# Patient Record
Sex: Male | Born: 1980 | Race: White | Hispanic: No | Marital: Married | Smoking: Former smoker
Health system: Southern US, Community
[De-identification: ages and names within clinical notes are randomized; demographics above are authoritative.]

## PROBLEM LIST (undated history)

## (undated) DIAGNOSIS — R569 Unspecified convulsions: Secondary | ICD-10-CM

## (undated) DIAGNOSIS — G809 Cerebral palsy, unspecified: Secondary | ICD-10-CM

## (undated) DIAGNOSIS — F419 Anxiety disorder, unspecified: Secondary | ICD-10-CM

## (undated) HISTORY — PX: OTHER SURGICAL HISTORY: SHX169

## (undated) HISTORY — PX: HAND SURGERY: SHX662

## (undated) HISTORY — DX: Unspecified convulsions: R56.9

---
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
## 2002-09-27 ENCOUNTER — Emergency Department (HOSPITAL_COMMUNITY): Admission: EM | Admit: 2002-09-27 | Discharge: 2002-09-28 | Payer: Self-pay | Admitting: Emergency Medicine

## 2007-06-14 ENCOUNTER — Ambulatory Visit: Payer: Self-pay | Admitting: Cardiology

## 2010-10-21 ENCOUNTER — Emergency Department (HOSPITAL_COMMUNITY)
Admission: EM | Admit: 2010-10-21 | Discharge: 2010-10-21 | Disposition: A | Discharge status: Home / Self Care | Payer: BC Managed Care – PPO | Attending: Emergency Medicine | Admitting: Emergency Medicine

## 2010-10-21 DIAGNOSIS — G43909 Migraine, unspecified, not intractable, without status migrainosus: Secondary | ICD-10-CM | POA: Insufficient documentation

## 2010-10-21 LAB — COMPREHENSIVE METABOLIC PANEL
ALT: 26 U/L (ref 0–53)
AST: 22 U/L (ref 0–37)
Albumin: 4.3 g/dL (ref 3.5–5.2)
Alkaline Phosphatase: 102 U/L (ref 39–117)
BUN: 8 mg/dL (ref 6–23)
CO2: 25 mEq/L (ref 19–32)
Calcium: 9.1 mg/dL (ref 8.4–10.5)
Chloride: 100 mEq/L (ref 96–112)
Creatinine, Ser: 0.84 mg/dL (ref 0.4–1.5)
GFR calc Af Amer: >60 mL/min (ref >60)
GFR calc non Af Amer: >60 mL/min (ref >60)
Glucose, Bld: 95 mg/dL (ref 70–99)
Potassium: 3.6 mEq/L (ref 3.5–5.1)
Sodium: 136 mEq/L (ref 135–145)
Total Bilirubin: 0.5 mg/dL (ref 0.3–1.2)
Total Protein: 7.1 g/dL (ref 6.0–8.3)

## 2010-10-21 LAB — DIFFERENTIAL
Basophils Absolute: 0 10*3/uL (ref 0.0–0.1)
Basophils Relative: 1 % (ref 0–1)
Eosinophils Absolute: 0.2 10*3/uL (ref 0.0–0.7)
Eosinophils Relative: 2 % (ref 0–5)
Lymphocytes Relative: 24 % (ref 12–46)
Lymphs Abs: 1.9 10*3/uL (ref 0.7–4.0)
Monocytes Absolute: 0.6 10*3/uL (ref 0.1–1.0)
Monocytes Relative: 7 % (ref 3–12)
Neutro Abs: 5.2 10*3/uL (ref 1.7–7.7)
Neutrophils Relative %: 66 % (ref 43–77)

## 2010-10-21 LAB — CBC
HCT: 43 % (ref 39.0–52.0)
Hemoglobin: 15.1 g/dL (ref 13.0–17.0)
MCH: 31.2 pg (ref 26.0–34.0)
MCHC: 35.1 g/dL (ref 30.0–36.0)
MCV: 88.8 fL (ref 78.0–100.0)
Platelets: 225 10*3/uL (ref 150–400)
RBC: 4.84 MIL/uL (ref 4.22–5.81)
RDW: 12 % (ref 11.5–15.5)
WBC: 7.9 10*3/uL (ref 4.0–10.5)

## 2010-10-21 LAB — CARBAMAZEPINE LEVEL, TOTAL: Carbamazepine Lvl: 8.4 ug/mL (ref 4.0–12.0)
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
## 2011-04-28 ENCOUNTER — Other Ambulatory Visit: Payer: Self-pay

## 2011-04-28 ENCOUNTER — Emergency Department (HOSPITAL_COMMUNITY)
Admission: EM | Admit: 2011-04-28 | Discharge: 2011-04-28 | Disposition: A | Discharge status: Home / Self Care | Payer: BC Managed Care – PPO | Attending: Emergency Medicine | Admitting: Emergency Medicine

## 2011-04-28 DIAGNOSIS — R002 Palpitations: Secondary | ICD-10-CM | POA: Insufficient documentation

## 2011-04-28 DIAGNOSIS — F410 Panic disorder [episodic paroxysmal anxiety] without agoraphobia: Secondary | ICD-10-CM | POA: Insufficient documentation

## 2011-04-28 DIAGNOSIS — F17200 Nicotine dependence, unspecified, uncomplicated: Secondary | ICD-10-CM | POA: Insufficient documentation

## 2011-04-28 HISTORY — DX: Cerebral palsy, unspecified: G80.9

## 2011-04-28 HISTORY — DX: Anxiety disorder, unspecified: F41.9

## 2011-04-28 HISTORY — DX: Unspecified convulsions: R56.9

## 2011-04-28 NOTE — ED Provider Notes (Signed)
History   Scribed for Dr. Bebe Shaggy, the patient was seen in room APA05/APA05. This chart was scribed by Clarita Crane. This patient's care was started at 11:23AM.   CSN: [id number] Arrival date & time: 04/28/2011 11:21 AM  Chief Complaint  Patient presents with  . Panic Attack    HPI Patrick Ray is a 30 y.o. male who presents to the Emergency Department complaining of episodic palpitations which occurred 20 minutes ago lasting 15-20 minutes but is currently resolved with no associated symptoms. States episode of palpitations is similar to that previously experienced with panic attacks. Denies LOC, syncope, seizure, HA, SOB, chest pain, vomiting, diaphoresis. Denies recent changes in medications, elicit drug abuse, recent extensive travel, family history of cardiac problems. Patient is a current smoker and has a h/o seizures. No drug use  HPI ELEMENTS: Onset: 20 minutes ago Duration: 15-20 minutes Quality: similar to that previously experienced with panic attacks Context:  as above  Associated symptoms: No associated symptoms.  Denies LOC, syncope, seizure, HA, SOB, chest pain, vomiting, diaphoresis.   PAST MEDICAL HISTORY:  Past Medical History  Diagnosis Date  . Seizure   . Anxiety   . Cerebral palsy     PAST SURGICAL HISTORY:  Past Surgical History  Procedure Date  . Hand surgery   . Arm surgery     FAMILY HISTORY:  No family history on file.   SOCIAL HISTORY: History   Social History  . Marital Status: Married    Spouse Name: N/A    Number of Children: N/A  . Years of Education: N/A   Social History Main Topics  . Smoking status: Current Everyday Smoker  . Smokeless tobacco: None  . Alcohol Use: No  . Drug Use:   . Sexually Active:    Other Topics Concern  . None   Social History Narrative  . None    Review of Systems 10 Systems reviewed and are negative for acute change except as noted in the HPI.  Allergies  Penicillins  Home Medications  No  current outpatient prescriptions on file.  BP 129/64  Pulse 86  Temp(Src) 98 F (36.7 C) (Oral)  Resp 18  Ht 5\' 10"  (1.778 m)  Wt 165 lb (74.844 kg)  BMI 23.68 kg/m2  SpO2 97%  Physical Exam CONSTITUTIONAL: Well developed/well nourished HEAD AND FACE: Normocephalic/atraumatic EYES: EOMI/PERRL ENMT: Mucous membranes moist NECK: supple no meningeal signs CV: S1/S2 noted, no murmurs/rubs/gallops noted LUNGS: Lungs are clear to auscultation bilaterally, no apparent distress NEURO: Pt is awake/alert, moves all extremitiesx4 EXTREMITIES: pulses normal, full ROM, radial pulses normal, PT pulses intact SKIN: warm, color normal PSYCH: no abnormalities of mood noted  ED Course  Procedures MDM   OTHER DATA REVIEWED: Nursing notes, vital signs, and past medical records reviewed.  DIAGNOSTIC STUDIES:   Date: 04/28/2011  Rate: 84 BPM  Rhythm: normal sinus rhythm  QRS Axis: normal  Intervals: normal  ST/T Wave abnormalities: normal  Conduction Disutrbances:none  Narrative Interpretation:   Old EKG Reviewed: none available   11:49AM- Previous records reviewed and considered. Patient's records reveal that patient with previous evaluation performed by Cardiologist for possible syncope though be due to seizures  MDM:   Pt well appearing, no syncope, no cp, no pleuritic pain Suspicion for serious cardiac dysrhythmia low   I personally performed the services described in this documentation, which was scribed in my presence. The recorded information has been reviewed and considered.         Dorinda Hill  Forestine Chute, MD 04/28/11 1236

## 2011-04-28 NOTE — ED Notes (Signed)
Pt reports pt was sitting in his office at work.  Reports felt very nervous and heart was racing.  Says was having to take deep breaths.  Reports EMS was called out for seizures but pt says did not have seizure.  Pt says thinks had anxiety attack.  Pt alert and oriented.

## 2012-05-14 ENCOUNTER — Ambulatory Visit: Payer: Managed Care, Other (non HMO) | Attending: Family Medicine | Admitting: Physical Therapy

## 2012-05-14 DIAGNOSIS — M25579 Pain in unspecified ankle and joints of unspecified foot: Secondary | ICD-10-CM | POA: Insufficient documentation

## 2012-05-14 DIAGNOSIS — M25669 Stiffness of unspecified knee, not elsewhere classified: Secondary | ICD-10-CM | POA: Insufficient documentation

## 2012-05-14 DIAGNOSIS — IMO0001 Reserved for inherently not codable concepts without codable children: Secondary | ICD-10-CM | POA: Insufficient documentation

## 2012-05-14 DIAGNOSIS — R262 Difficulty in walking, not elsewhere classified: Secondary | ICD-10-CM | POA: Insufficient documentation

## 2012-05-17 ENCOUNTER — Ambulatory Visit: Payer: Managed Care, Other (non HMO) | Admitting: Physical Therapy

## 2012-05-24 ENCOUNTER — Ambulatory Visit: Payer: Managed Care, Other (non HMO) | Admitting: Physical Therapy

## 2012-05-29 ENCOUNTER — Ambulatory Visit: Payer: Managed Care, Other (non HMO) | Attending: Family Medicine | Admitting: Physical Therapy

## 2012-05-29 DIAGNOSIS — R262 Difficulty in walking, not elsewhere classified: Secondary | ICD-10-CM | POA: Insufficient documentation

## 2012-05-29 DIAGNOSIS — IMO0001 Reserved for inherently not codable concepts without codable children: Secondary | ICD-10-CM | POA: Insufficient documentation

## 2012-05-29 DIAGNOSIS — M25579 Pain in unspecified ankle and joints of unspecified foot: Secondary | ICD-10-CM | POA: Insufficient documentation

## 2012-05-29 DIAGNOSIS — M25669 Stiffness of unspecified knee, not elsewhere classified: Secondary | ICD-10-CM | POA: Insufficient documentation

## 2013-01-03 ENCOUNTER — Other Ambulatory Visit (INDEPENDENT_AMBULATORY_CARE_PROVIDER_SITE_OTHER): Payer: PRIVATE HEALTH INSURANCE

## 2013-01-03 DIAGNOSIS — G40209 Localization-related (focal) (partial) symptomatic epilepsy and epileptic syndromes with complex partial seizures, not intractable, without status epilepticus: Secondary | ICD-10-CM

## 2013-01-03 NOTE — Progress Notes (Signed)
Patient here today for labs only for dr Jomarie Longs Chriss Czar.

## 2013-08-08 ENCOUNTER — Telehealth: Payer: Self-pay | Admitting: Family Medicine

## 2013-08-08 NOTE — Telephone Encounter (Signed)
PT HAVING CP WITH  MOVEMENT X 1 WEEK PER WIFE. PT AT [place] TO COME TO OFFICE TO BE SEEN BEFORE Monday. I ADVISED PT NEEDS TO GO TO ER TODAY TO BE EVALUATED. HIS WIFE SAID SHE WOULD DISCUSS WITH HIM.

## 2013-08-09 ENCOUNTER — Ambulatory Visit (INDEPENDENT_AMBULATORY_CARE_PROVIDER_SITE_OTHER): Payer: PRIVATE HEALTH INSURANCE | Admitting: Family Medicine

## 2013-08-09 ENCOUNTER — Ambulatory Visit (INDEPENDENT_AMBULATORY_CARE_PROVIDER_SITE_OTHER): Payer: PRIVATE HEALTH INSURANCE

## 2013-08-09 ENCOUNTER — Encounter: Payer: Self-pay | Admitting: Family Medicine

## 2013-08-09 VITALS — BP 115/67 | HR 78 | Temp 97.0°F | Wt 183.2 lb

## 2013-08-09 DIAGNOSIS — G808 Other cerebral palsy: Secondary | ICD-10-CM | POA: Insufficient documentation

## 2013-08-09 DIAGNOSIS — R079 Chest pain, unspecified: Secondary | ICD-10-CM

## 2013-08-09 DIAGNOSIS — R569 Unspecified convulsions: Secondary | ICD-10-CM | POA: Insufficient documentation

## 2013-08-09 DIAGNOSIS — G809 Cerebral palsy, unspecified: Secondary | ICD-10-CM | POA: Insufficient documentation

## 2013-08-09 DIAGNOSIS — M62838 Other muscle spasm: Secondary | ICD-10-CM

## 2013-08-09 NOTE — Patient Instructions (Addendum)
      Dr Paula Libra Recommendations  For nutrition information, I recommend books:  1).Eat to Live by Dr Excell Seltzer. 2).Prevent and Reverse Heart Disease by Dr Karl Luke. 3) Dr Janene Harvey Book:  Program to Reverse Diabetes  Exercise recommendations are:  If unable to walk, then the patient can exercise in a chair 3 times a day. By flapping arms like a bird gently and raising legs outwards to the front.  If ambulatory, the patient can go for walks for 30 minutes 3 times a week. Then increase the intensity and duration as tolerated.  Goal is to try to attain exercise frequency to 5 times a week.  If applicable: Best to perform resistance exercises (machines or weights) 2 days a week and cardio type exercises 3 days per week.   Aleve: 2 tablets every 12 hours for the muscle pain. Massage daily [facility].

## 2013-08-09 NOTE — Progress Notes (Signed)
Patient ID: Patrick Ray, male   DOB: 1981-05-07, 33 y.o.   MRN: [id number] SUBJECTIVE: CC: Chief Complaint  Patient presents with  . Acute Visit    pain comes and goes c/o pain left back rib area . states on thursday was putting on belt with his right hand stated on left side felt like "ice pick going left side . Denies SOB  no recent cough states when he takes tylenol or motrin the pain goes ilmmediately but pain returns in 6 hrs.     HPI: Start of this week. Woke up with a bad pain in the left side of the trunk. Hurt to breathe deep.sat around and dealt with the pain and took Xtra strength tylenol. After a couple of days everything was fine. Then he was getting dressed was putting on his belt with his pants on then he had a sharp pain of the left ribcage and left latissimus dorsi area. Was 2 minutes before could move.  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  Took some advil the pain went away but when he was going home the pain is returning. Past Medical History  Diagnosis Date  . Seizure   . Anxiety   . Cerebral palsy   . Seizures   . Cerebral palsy   . Forceps delivery    Past Surgical History  Procedure Laterality Date  . Hand surgery    . Arm surgery     History   Social History  . Marital Status: Married    Spouse Name: N/A    Number of Children: N/A  . Years of Education: N/A   Occupational History  . Not on file.   Social History Main Topics  . Smoking status: Current Every Day Smoker  . Smokeless tobacco: Not on file  . Alcohol Use: No  . Drug Use:   . Sexual Activity:    Other Topics Concern  . Not on file   Social History Narrative  . No narrative on file   No family history on file. Current Outpatient Prescriptions on File Prior to Visit  Medication Sig Dispense Refill  . calcium citrate-vitamin D 200-200 MG-UNIT TABS Take 1 tablet by mouth daily.        . carbamazepine (TEGRETOL XR) 400 MG 12 hr tablet Take 400 mg by mouth 3  (three) times daily after meals.        . cholecalciferol (VITAMIN D) 1000 UNITS tablet Take 1,000 Units by mouth daily.        Marland Kitchen FLUoxetine (PROZAC) 20 MG capsule Take 20 mg by mouth daily.        . multivitamin (THERAGRAN) per tablet Take 1 tablet by mouth daily.         No current facility-administered medications on file prior to visit.   Allergies  Allergen Reactions  . Penicillins    Immunization History  Administered Date(s) Administered  . Influenza Split 04/17/2013   Prior to Admission medications   Medication Sig Start Date End Date Taking? Authorizing Provider  calcium citrate-vitamin D 200-200 MG-UNIT TABS Take 1 tablet by mouth daily.      Historical Provider, MD  carbamazepine (TEGRETOL XR) 400 MG 12 hr tablet Take 400 mg by mouth 3 (three) times daily after meals.      Historical Provider, MD  cholecalciferol (VITAMIN D) 1000 UNITS tablet Take 1,000 Units by mouth daily.      Historical Provider, MD  FLUoxetine (PROZAC) 20 MG capsule Take 20 mg  by mouth daily.      Historical Provider, MD  multivitamin [facility]) per tablet Take 1 tablet by mouth daily.      Historical Provider, MD     ROS: As above in the HPI. All other systems are stable or negative.  OBJECTIVE: APPEARANCE:  Patient in no acute distress.The patient appeared well nourished and normally developed. Acyanotic. Waist: VITAL SIGNS:BP 115/67  Pulse 78  Temp(Src) 97 F (36.1 C) (Oral)  Wt 183 lb 3.2 oz (83.099 kg)   SKIN: warm and  Dry without overt rashes, tattoos and scars  HEAD and Neck: without JVD, Head and scalp: normal Eyes:No scleral icterus. Fundi normal, eye movements normal. Ears: Auricle normal, canal normal, Tympanic membranes normal, insufflation normal. Nose: normal Throat: normal Neck & thyroid: normal  CHEST & LUNGS: Chest wall: normal Lungs: Clear  CVS: Reveals the PMI to be normally located. Regular rhythm, First and Second Heart sounds are normal,  absence of  murmurs, rubs or gallops. Peripheral vasculature: Radial pulses: normal Dorsal pedis pulses: normal Posterior pulses: normal  ABDOMEN:  Appearance: normal Benign, no organomegaly, no masses, no Abdominal Aortic enlargement. No Guarding , no rebound. No Bruits. Bowel sounds: normal  RECTAL: N/A GU: N/A  EXTREMETIES: nonedematous.  MUSCULOSKELETAL:  Spine: slight thoracic scoliosis secondary to muscle assymetry. Left trunk smaller than the right Joints: intact Left Latissimus dorsi tight  NEUROLOGIC: oriented to time,place and person; left hemiplegic spasticity mild. Left hand in flexion. Less coordinated on the left side of the body. Strength is normal Sensory is normal Reflexes are normal Cranial Nerves are normal.  ASSESSMENT: Chest pain, unspecified - Plan: DG Chest 2 View  Muscle spasm  Cerebral palsy, hemiplegic  PLAN:  Orders Placed This Encounter  Procedures  . DG Chest 2 View    Standing Status: Future     Number of Occurrences: 1     Standing Expiration Date: 10/08/2014    Order Specific Question:  Reason for Exam (SYMPTOM  OR DIAGNOSIS REQUIRED)    Answer:  chest pain with movement    Order Specific Question:  Preferred imaging location?    Answer:  Internal  [facility] reading (PRIMARY) by  Dr. Jacelyn Grip: no acute disease seen.  Previous EKG is normal.                             Dr Paula Libra Recommendations  For nutrition information, I recommend books:  1).Eat to Live by Dr Excell Seltzer. 2).Prevent and Reverse Heart Disease by Dr Karl Luke. 3) Dr Janene Harvey Book:  Program to Reverse Diabetes  Exercise recommendations are:  If unable to walk, then the patient can exercise in a chair 3 times a day. By flapping arms like a bird gently and raising legs outwards to the front.  If ambulatory, the patient can go for walks for 30 minutes 3 times a week. Then increase the intensity and duration as tolerated.  Goal is to try to attain exercise  frequency to 5 times a week.  If applicable: Best to perform resistance exercises (machines or weights) 2 days a week and cardio type exercises 3 days per week.   Aleve: 2 tablets every 12 hours for the muscle pain. Massage daily [facility].     Meds ordered this encounter  Medications  . levETIRAcetam (KEPPRA) 500 MG tablet    Sig:   . Melatonin 10 MG CAPS    Sig: Take 1 capsule  by mouth at bedtime.   There are no discontinued medications. Return in about 2 months (around 10/07/2013) for CPE.  Addalynne Golding P. Jacelyn Grip, M.D.

## 2013-08-12 ENCOUNTER — Ambulatory Visit: Payer: PRIVATE HEALTH INSURANCE | Admitting: Family Medicine

## 2013-10-08 ENCOUNTER — Encounter: Payer: Self-pay | Admitting: Family Medicine

## 2013-10-08 ENCOUNTER — Ambulatory Visit (INDEPENDENT_AMBULATORY_CARE_PROVIDER_SITE_OTHER): Payer: PRIVATE HEALTH INSURANCE | Admitting: Family Medicine

## 2013-10-08 VITALS — BP 113/63 | HR 73 | Temp 98.0°F | Ht 70.0 in | Wt 183.6 lb

## 2013-10-08 DIAGNOSIS — Z13220 Encounter for screening for lipoid disorders: Secondary | ICD-10-CM

## 2013-10-08 DIAGNOSIS — Z0000 Encounter for general adult medical examination without abnormal findings: Secondary | ICD-10-CM | POA: Insufficient documentation

## 2013-10-08 DIAGNOSIS — G809 Cerebral palsy, unspecified: Secondary | ICD-10-CM

## 2013-10-08 DIAGNOSIS — R569 Unspecified convulsions: Secondary | ICD-10-CM

## 2013-10-08 DIAGNOSIS — G808 Other cerebral palsy: Secondary | ICD-10-CM

## 2013-10-08 DIAGNOSIS — M62838 Other muscle spasm: Secondary | ICD-10-CM

## 2013-10-08 NOTE — Progress Notes (Signed)
Patient ID: Patrick Ray, male   DOB: 11-22-1980, 33 y.o.   MRN: [id number] SUBJECTIVE: CC: Chief Complaint  Patient presents with  . Annual Exam    cpx no complaints     HPI:  See Neurologist Dr Sabra Heck in [place] . Checked approximately 6 months ago. Blood levels were fine. [facility]. Has been quite a long time since he has had a seizure. Last one was last year  Annual physical.   Past Medical History  Diagnosis Date  . Seizure   . Anxiety   . Cerebral palsy   . Seizures   . Cerebral palsy   . Forceps delivery    Past Surgical History  Procedure Laterality Date  . Hand surgery    . Arm surgery     History   Social History  . Marital Status: Married    Spouse Name: N/A    Number of Children: N/A  . Years of Education: N/A   Occupational History  . Not on file.   Social History Main Topics  . Smoking status: Current Every Day Smoker  . Smokeless tobacco: Not on file  . Alcohol Use: No  . Drug Use:   . Sexual Activity:    Other Topics Concern  . Not on file   Social History Narrative  . No narrative on file   No family history on file. Current Outpatient Prescriptions on File Prior to Visit  Medication Sig Dispense Refill  . calcium citrate-vitamin D 200-200 MG-UNIT TABS Take 1 tablet by mouth daily.        . carbamazepine (TEGRETOL XR) 400 MG 12 hr tablet Take 400 mg by mouth 3 (three) times daily after meals.        . cholecalciferol (VITAMIN D) 1000 UNITS tablet Take 1,000 Units by mouth daily.        Marland Kitchen FLUoxetine (PROZAC) 20 MG capsule Take 20 mg by mouth daily.        Marland Kitchen levETIRAcetam (KEPPRA) 500 MG tablet       . Melatonin 10 MG CAPS Take 1 capsule by mouth at bedtime.      . multivitamin (THERAGRAN) per tablet Take 1 tablet by mouth daily.         No current facility-administered medications on file prior to visit.   Allergies  Allergen Reactions  . Penicillins    Immunization History  Administered Date(s) Administered  .  Influenza Split 04/17/2013  . Tetanus 12/14/2006   Prior to Admission medications   Medication Sig Start Date End Date Taking? Authorizing Provider  calcium citrate-vitamin D 200-200 MG-UNIT TABS Take 1 tablet by mouth daily.      Historical Provider, MD  carbamazepine (TEGRETOL XR) 400 MG 12 hr tablet Take 400 mg by mouth 3 (three) times daily after meals.      Historical Provider, MD  cholecalciferol (VITAMIN D) 1000 UNITS tablet Take 1,000 Units by mouth daily.      Historical Provider, MD  FLUoxetine (PROZAC) 20 MG capsule Take 20 mg by mouth daily.      Historical Provider, MD  levETIRAcetam (KEPPRA) 500 MG tablet  07/31/13   Historical Provider, MD  Melatonin 10 MG CAPS Take 1 capsule by mouth at bedtime.    Historical Provider, MD  multivitamin [facility]) per tablet Take 1 tablet by mouth daily.      Historical Provider, MD     ROS: As above in the HPI. All other systems are stable or negative.  OBJECTIVE:  APPEARANCE:  Patient in no acute distress.The patient appeared well nourished and normally developed. Acyanotic. Waist: VITAL SIGNS:BP 113/63  Pulse 73  Temp(Src) 98 F (36.7 C) (Oral)  Ht 5' 10"  (1.778 m)  Wt 183 lb 9.6 oz (83.28 kg)  BMI 26.34 kg/m2 WM  SKIN: warm and  Dry without overt rashes, tattoos and scars  HEAD and Neck: without JVD, Head and scalp: normal Eyes:No scleral icterus. Fundi normal, eye movements normal. Ears: Auricle normal, canal normal, Tympanic membranes normal, insufflation normal. Nose: normal Throat: normal Neck & thyroid: normal  CHEST & LUNGS: Chest wall: normal Lungs: Clear  CVS: Reveals the PMI to be normally located. Regular rhythm, First and Second Heart sounds are normal,  absence of murmurs, rubs or gallops. Peripheral vasculature: Radial pulses: normal Dorsal pedis pulses: normal Posterior pulses: normal  ABDOMEN:  Appearance: normal Benign, no organomegaly, no masses, no Abdominal Aortic enlargement. No Guarding ,  no rebound. No Bruits. Bowel sounds: normal  RECTAL: N/A GU: Normal  EXTREMETIES: nonedematous.  MUSCULOSKELETAL:  Spine: normal Joints:left upper extremity spasticity  NEUROLOGIC: oriented to time,place and person; mild left hemiplegia  ASSESSMENT:  Annual physical exam - Plan: CMP14+EGFR  Cerebral palsy, hemiplegic  Cerebral palsy  Seizures  Muscle spasm  Forceps delivery  Screening cholesterol level - Plan: Lipid panel  PLAN:      HEALTH MAINTENANCE Immunizations: Tetanus-Diphtheria Booster due:2018 Pertusis Booster due:2018 Flu Shot Due: every Fall Pneumonia Vaccine: usually at 32 years of age unless there are certain risk situations. Herpes Zoster/Shingles Vaccine due: usually at 32 years of age HPV FGB:MSXJ age 88 to 54 years in males and females.  Healthy Life Habits: Exercise Goal: 5-6 days/week; start gradually(ie 30 minutes/3days per week) Nutrition: Balanced healthy meals including Vegetables and Fruits. Consider  Reading the following books: 1) Eat to Live by Dr Diona Browner; 2) Prevent and Reverse Heart Disease by Dr Karl Luke.  Vitamins:multivitamin okay Aspirin:n/a Stop Tobacco Use:++++ Seat Belt Use:+++ recommended Sunscreen Use:+++ recommended Recommended Screening Tests: Colon Cancer Screening: at 33 years old Blood work: today Cholesterol Screening:     today       Eye Exam: every 1 to 2 years recommended Dental Health: at least every 6 months  Others:    Living Will/Healthcare Power of Attorney: should have this in order with your personal estate planning   Smoking cessation discussed counselled and handout in the AVS. His cigarettes are free.   Orders Placed This Encounter  Procedures  . CMP14+EGFR    Standing Status: Future     Number of Occurrences:      Standing Expiration Date: 10/09/2014    Order Specific Question:  Has the patient fasted?    Answer:  Yes  . Lipid panel    Standing Status: Future     Number  of Occurrences:      Standing Expiration Date: 10/09/2014    Order Specific Question:  Has the patient fasted?    Answer:  Yes   Meds ordered this encounter  Medications  . Omega-3 Fatty Acids (FISH OIL) 300 MG CAPS    Sig: Take 300 mg by mouth 2 (two) times daily.   There are no discontinued medications. Return if symptoms worsen or fail to improve.  Keelon Zurn P. Jacelyn Grip, M.D.

## 2013-10-08 NOTE — Patient Instructions (Addendum)
HEALTH MAINTENANCE Immunizations: Tetanus-Diphtheria Booster due:2018 Pertusis Booster due:2018 Flu Shot Due: every Fall Pneumonia Vaccine: usually at 32 years of age unless there are certain risk situations. Herpes Zoster/Shingles Vaccine due: usually at 32 years of age HPV RXV:[id number] age 59 to 31 years in males and females.  Healthy Life Habits: Exercise Goal: 5-6 days/week; start gradually(ie 30 minutes/3days per week) Nutrition: Balanced healthy meals including Vegetables and Fruits. Consider  Reading the following books: 1) Eat to Live by Dr Diona Browner; 2) Prevent and Reverse Heart Disease by Dr Karl Luke.  Vitamins:multivitamin okay Aspirin:n/a Stop Tobacco Use:++++ Seat Belt Use:+++ recommended Sunscreen Use:+++ recommended Recommended Screening Tests: Colon Cancer Screening: at 33 years old Blood work: today Cholesterol Screening:     today       Eye Exam: every 1 to 2 years recommended Dental Health: at least every 6 months  Others:    Living Will/Healthcare Power of Attorney: should have this in order with your personal estate planning   Smoking Cessation Quitting smoking is important to your health and has many advantages. However, it is not always easy to quit since nicotine is a very addictive drug. Often times, people try 3 times or more before being able to quit. This document explains the best ways for you to prepare to quit smoking. Quitting takes hard work and a lot of effort, but you can do it. ADVANTAGES OF QUITTING SMOKING  You will live longer, feel better, and live better.  Your body will feel the impact of quitting smoking almost immediately.  Within 20 minutes, blood pressure decreases. Your pulse returns to its normal level.  After 8 hours, carbon monoxide levels in the blood return to normal. Your oxygen level increases.  After 24 hours, the chance of having a heart attack starts to decrease. Your breath, hair, and body stop smelling  like smoke.  After 48 hours, damaged nerve endings begin to recover. Your sense of taste and smell improve.  After 72 hours, the body is virtually free of nicotine. Your bronchial tubes relax and breathing becomes easier.  After 2 to 12 weeks, lungs can hold more air. Exercise becomes easier and circulation improves.  The risk of having a heart attack, stroke, cancer, or lung disease is greatly reduced.  After 1 year, the risk of coronary heart disease is cut in half.  After 5 years, the risk of stroke falls to the same as a nonsmoker.  After 10 years, the risk of lung cancer is cut in half and the risk of other cancers decreases significantly.  After 15 years, the risk of coronary heart disease drops, usually to the level of a nonsmoker.  If you are pregnant, quitting smoking will improve your chances of having a healthy baby.  The people you live with, especially any children, will be healthier.  You will have extra money to spend on things other than cigarettes. QUESTIONS TO THINK ABOUT BEFORE ATTEMPTING TO QUIT You may want to talk about your answers with your caregiver.  Why do you want to quit?  If you tried to quit in the past, what helped and what did not?  What will be the most difficult situations for you after you quit? How will you plan to handle them?  Who can help you through the tough times? Your family? Friends? A caregiver?  What pleasures do you get from smoking? What ways can you still get pleasure if you quit? Here are some questions to ask your caregiver:  How can you help me to be successful at quitting?  What medicine do you think would be best for me and how should I take it?  What should I do if I need more help?  What is smoking withdrawal like? How can I get information on withdrawal? GET READY  Set a quit date.  Change your environment by getting rid of all cigarettes, ashtrays, matches, and lighters in your home, car, or work. Do not let  people smoke in your home.  Review your past attempts to quit. Think about what worked and what did not. GET SUPPORT AND ENCOURAGEMENT You have a better chance of being successful if you have help. You can get support in many ways.  Tell your family, friends, and co-workers that you are going to quit and need their support. Ask them not to smoke around you.  Get individual, group, or telephone counseling and support. Programs are available at [employer] and health centers. Call your local health department for information about programs in your area.  Spiritual beliefs and practices may help some smokers quit.  Download a "quit meter" on your computer to keep track of quit statistics, such as how long you have gone without smoking, cigarettes not smoked, and money saved.  Get a self-help book about quitting smoking and staying off of tobacco. [place] yourself from urges to smoke. Talk to someone, go for a walk, or occupy your time with a task.  Change your normal routine. Take a different route to work. Drink tea instead of coffee. Eat breakfast in a different place.  Reduce your stress. Take a hot bath, exercise, or read a book.  Plan something enjoyable to do every day. Reward yourself for not smoking.  Explore interactive web-based programs that specialize in helping you quit. GET MEDICINE AND USE IT CORRECTLY Medicines can help you stop smoking and decrease the urge to smoke. Combining medicine with the above behavioral methods and support can greatly increase your chances of successfully quitting smoking.  Nicotine replacement therapy helps deliver nicotine to your body without the negative effects and risks of smoking. Nicotine replacement therapy includes nicotine gum, lozenges, inhalers, nasal sprays, and skin patches. Some may be available over-the-counter and others require a prescription.  Antidepressant medicine helps people abstain  from smoking, but how this works is unknown. This medicine is available by prescription.  Nicotinic receptor partial agonist medicine simulates the effect of nicotine in your brain. This medicine is available by prescription. Ask your caregiver for advice about which medicines to use and how to use them based on your health history. Your caregiver will tell you what side effects to look out for if you choose to be on a medicine or therapy. Carefully read the information on the package. Do not use any other product containing nicotine while using a nicotine replacement product.  RELAPSE OR DIFFICULT SITUATIONS Most relapses occur within the first 3 months after quitting. Do not be discouraged if you start smoking again. Remember, most people try several times before finally quitting. You may have symptoms of withdrawal because your body is used to nicotine. You may crave cigarettes, be irritable, feel very hungry, cough often, get headaches, or have difficulty concentrating. The withdrawal symptoms are only temporary. They are strongest when you first quit, but they will go away within 10 14 days. To reduce the chances of relapse, try to:  Avoid drinking alcohol. Drinking lowers your chances of successfully quitting.  Reduce the amount of caffeine you consume. Once you quit smoking, the amount of caffeine in your body increases and can give you symptoms, such as a rapid heartbeat, sweating, and anxiety.  Avoid smokers because they can make you want to smoke.  Do not let weight gain distract you. Many smokers will gain weight when they quit, usually less than 10 pounds. Eat a healthy diet and stay active. You can always lose the weight gained after you quit.  Find ways to improve your mood other than smoking. FOR MORE INFORMATION  www.smokefree.gov  Document Released: 07/05/2001 Document Revised: 01/10/2012 Document Reviewed: 10/20/2011 [facility] Patient Information 2014 [place], [place].

## 2013-10-10 ENCOUNTER — Other Ambulatory Visit: Payer: PRIVATE HEALTH INSURANCE

## 2013-10-10 DIAGNOSIS — Z13220 Encounter for screening for lipoid disorders: Secondary | ICD-10-CM

## 2013-10-10 DIAGNOSIS — Z0000 Encounter for general adult medical examination without abnormal findings: Secondary | ICD-10-CM

## 2013-10-11 LAB — CMP14+EGFR
ALT: 28 IU/L (ref 0–44)
AST: 19 IU/L (ref 0–40)
Albumin/Globulin Ratio: 1.7 (ref 1.1–2.5)
Albumin: 4.5 g/dL (ref 3.5–5.5)
Alkaline Phosphatase: 120 IU/L — ABNORMAL HIGH (ref 39–117)
BUN/Creatinine Ratio: 11 (ref 8–19)
BUN: 12 mg/dL (ref 6–20)
CO2: 24 mmol/L (ref 18–29)
Calcium: 8.6 mg/dL — ABNORMAL LOW (ref 8.7–10.2)
Chloride: 103 mmol/L (ref 97–108)
Creatinine, Ser: 1.07 mg/dL (ref 0.76–1.27)
GFR calc Af Amer: 106 mL/min/{1.73_m2} (ref >59)
GFR calc non Af Amer: 92 mL/min/{1.73_m2} (ref >59)
Globulin, Total: 2.6 g/dL (ref 1.5–4.5)
Glucose: 89 mg/dL (ref 65–99)
Potassium: 4.2 mmol/L (ref 3.5–5.2)
Sodium: 143 mmol/L (ref 134–144)
Total Bilirubin: 0.3 mg/dL (ref 0.0–1.2)
Total Protein: 7.1 g/dL (ref 6.0–8.5)

## 2013-10-11 LAB — LIPID PANEL
Chol/HDL Ratio: 6.6 ratio units — ABNORMAL HIGH (ref 0.0–5.0)
Cholesterol, Total: 249 mg/dL — ABNORMAL HIGH (ref 100–199)
HDL: 38 mg/dL — ABNORMAL LOW (ref >39)
LDL Calculated: 176 mg/dL — ABNORMAL HIGH (ref 0–99)
Triglycerides: 173 mg/dL — ABNORMAL HIGH (ref 0–149)
VLDL Cholesterol Cal: 35 mg/dL (ref 5–40)

## 2013-10-12 ENCOUNTER — Other Ambulatory Visit: Payer: Self-pay | Admitting: Family Medicine

## 2013-10-12 DIAGNOSIS — E785 Hyperlipidemia, unspecified: Secondary | ICD-10-CM

## 2013-10-12 NOTE — Progress Notes (Signed)
Quick Note:  Call Patient Labs that are abnormal: Lipids are high  The rest are at goal  Recommendations: Needs to stop all smoking. Needs to read the book " EAT TO LIVE" Plant based Diet and exercise. Needs a lab appointment to recheck the lipids in 3 months. If high he wil need to be on a statin. Lab ordered in [facility].   ______

## 2013-10-15 ENCOUNTER — Telehealth: Payer: Self-pay | Admitting: Family Medicine

## 2013-10-15 NOTE — Telephone Encounter (Signed)
Notified pt of lab results 

## 2014-03-25 ENCOUNTER — Telehealth: Payer: Self-pay | Admitting: Family Medicine

## 2014-03-25 NOTE — Telephone Encounter (Signed)
appt given for tomorrow with Bill.

## 2014-03-26 ENCOUNTER — Ambulatory Visit (INDEPENDENT_AMBULATORY_CARE_PROVIDER_SITE_OTHER): Payer: PRIVATE HEALTH INSURANCE | Admitting: Family Medicine

## 2014-03-26 VITALS — BP 123/72 | HR 75 | Temp 97.4°F | Ht 70.0 in | Wt 183.4 lb

## 2014-03-26 DIAGNOSIS — M25519 Pain in unspecified shoulder: Secondary | ICD-10-CM

## 2014-03-26 DIAGNOSIS — M25512 Pain in left shoulder: Secondary | ICD-10-CM

## 2014-03-26 MED ORDER — METAXALONE 800 MG PO TABS: 800.0000 mg | ORAL_TABLET | Freq: Three times a day (TID) | ORAL | Status: DC

## 2014-03-26 MED ORDER — NAPROXEN 500 MG PO TABS: 500.0000 mg | ORAL_TABLET | Freq: Two times a day (BID) | ORAL | Status: DC

## 2014-03-26 NOTE — Progress Notes (Signed)
   Subjective:    Patient ID: Patrick Ray, male    DOB: 1980-11-18, 33 y.o.   MRN: [id number]  HPI  C/o left shoulder pain and discomfort for several days.  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  Review of Systems No chest pain, SOB, HA, dizziness, vision change, N/V, diarrhea, constipation, dysuria, urinary urgency or frequency, myalgias, arthralgias or rash.     Objective:   Physical Exam Vital signs noted  Well developed well nourished male.  HEENT - Head atraumatic Normocephalic                Eyes - PERRLA, Conjuctiva - clear Sclera- Clear EOMI                Ears - EAC's Wnl TM's Wnl Gross Hearing WNL                Throat - oropharanx wnl Respiratory - Lungs CTA bilateral Cardiac - RRR S1 and S2 without murmur GI - Abdomen soft Nontender and bowel sounds active x 4      Assessment & Plan:  Left shoulder pain Naprosyn 500mg  one po bid x 10 days #20 Zanaflex 800mg  one po tid #30w/5rf Follow up prn if not better.  Lysbeth Penner FNP

## 2015-03-12 ENCOUNTER — Ambulatory Visit (INDEPENDENT_AMBULATORY_CARE_PROVIDER_SITE_OTHER): Payer: PRIVATE HEALTH INSURANCE | Admitting: Family Medicine

## 2015-03-12 ENCOUNTER — Encounter: Payer: Self-pay | Admitting: Family Medicine

## 2015-03-12 VITALS — BP 124/75 | HR 84 | Temp 97.3°F | Ht 70.0 in | Wt 185.6 lb

## 2015-03-12 DIAGNOSIS — J0111 Acute recurrent frontal sinusitis: Secondary | ICD-10-CM

## 2015-03-12 DIAGNOSIS — J0110 Acute frontal sinusitis, unspecified: Secondary | ICD-10-CM | POA: Insufficient documentation

## 2015-03-12 MED ORDER — AZITHROMYCIN 250 MG PO TABS: ORAL_TABLET | ORAL | Status: DC

## 2015-03-12 NOTE — Assessment & Plan Note (Signed)
Frontal sinusitis with history of recurrent sinusitis Has only been ill for 4 days, ask him to wait 4 more days and try aggressive nasal saline in the meantime. If not better in 4 days start azithromycin Plenty of fluids Return precautions provided

## 2015-03-12 NOTE — Patient Instructions (Signed)
For the next four days get plenty of fluids and try nasal saline. You may use her try nasal saline spray 3-4 times a day with good nose blowing or the neti pot (watch teh video on youtube).   If you're not better by Monday go ahead and try the anti-biotics.    Sinusitis Sinusitis is redness, soreness, and inflammation of the paranasal sinuses. Paranasal sinuses are air pockets within the bones of your face (beneath the eyes, the middle of the forehead, or above the eyes). In healthy paranasal sinuses, mucus is able to drain out, and air is able to circulate through them by way of your nose. However, when your paranasal sinuses are inflamed, mucus and air can become trapped. This can allow bacteria and other germs to grow and cause infection. Sinusitis can develop quickly and last only a short time (acute) or continue over a long period (chronic). Sinusitis that lasts for more than 12 weeks is considered chronic.  CAUSES  Causes of sinusitis include:  Allergies.  Structural abnormalities, such as displacement of the cartilage that separates your nostrils (deviated septum), which can decrease the air flow through your nose and sinuses and affect sinus drainage.  Functional abnormalities, such as when the small hairs (cilia) that line your sinuses and help remove mucus do not work properly or are not present. SIGNS AND SYMPTOMS  Symptoms of acute and chronic sinusitis are the same. The primary symptoms are pain and pressure around the affected sinuses. Other symptoms include:  Upper toothache.  Earache.  Headache.  Bad breath.  Decreased sense of smell and taste.  A cough, which worsens when you are lying flat.  Fatigue.  Fever.  Thick drainage from your nose, which often is green and may contain pus (purulent).  Swelling and warmth over the affected sinuses. DIAGNOSIS  Your health care provider will perform a physical exam. During the exam, your health care provider may:  Look  in your nose for signs of abnormal growths in your nostrils (nasal polyps).  Tap over the affected sinus to check for signs of infection.  View the inside of your sinuses (endoscopy) using an imaging device that has a light attached (endoscope). If your health care provider suspects that you have chronic sinusitis, one or more of the following tests may be recommended:  Allergy tests.  Nasal culture. A sample of mucus is taken from your nose, sent to a lab, and screened for bacteria.  Nasal cytology. A sample of mucus is taken from your nose and examined by your health care provider to determine if your sinusitis is related to an allergy. TREATMENT  Most cases of acute sinusitis are related to a viral infection and will resolve on their own within 10 days. Sometimes medicines are prescribed to help relieve symptoms (pain medicine, decongestants, nasal steroid sprays, or saline sprays).  However, for sinusitis related to a bacterial infection, your health care provider will prescribe antibiotic medicines. These are medicines that will help kill the bacteria causing the infection.  Rarely, sinusitis is caused by a fungal infection. In theses cases, your health care provider will prescribe antifungal medicine. For some cases of chronic sinusitis, surgery is needed. Generally, these are cases in which sinusitis recurs more than 3 times per year, despite other treatments. HOME CARE INSTRUCTIONS   Drink plenty of water. Water helps thin the mucus so your sinuses can drain more easily.  Use a humidifier.  Inhale steam 3 to 4 times a day (for example,  sit in the bathroom with the shower running).  Apply a warm, moist washcloth to your face 3 to 4 times a day, or as directed by your health care provider.  Use saline nasal sprays to help moisten and clean your sinuses.  Take medicines only as directed by your health care provider.  If you were prescribed either an antibiotic or antifungal  medicine, finish it all even if you start to feel better. SEEK IMMEDIATE MEDICAL CARE IF:  You have increasing pain or severe headaches.  You have nausea, vomiting, or drowsiness.  You have swelling around your face.  You have vision problems.  You have a stiff neck.  You have difficulty breathing. MAKE SURE YOU:   Understand these instructions.  Will watch your condition.  Will get help right away if you are not doing well or get worse. Document Released: 07/11/2005 Document Revised: 11/25/2013 Document Reviewed: 07/26/2011 [facility] Patient Information 2015 [place], [place]. This information is not intended to replace advice given to you by your health care provider. Make sure you discuss any questions you have with your health care provider.

## 2015-03-12 NOTE — Progress Notes (Signed)
Patient ID: Patrick Ray, male   DOB: 12-23-1980, 34 y.o.   MRN: [id number]   HPI  Patient presents today for acute visit tonight he went for sinus infection  Patient explains that he gets 2-3 sinus infections per year, usually only when the weather changes. For the last 4 days he's had frontal sinus pressure, postnasal drip, productive cough, and generalized 10 tightness. He denies fever, chills, malaise, change in appetite, dyspnea, and chest pain  He is allergic to penicillin seen usually uses azithromycin with good results.  PMH: Smoking status noted ROS: Per HPI  Objective: BP 124/75 mmHg  Pulse 84  Temp(Src) 97.3 F (36.3 C) (Oral)  Ht 5\' 10"  (1.778 m)  Wt 185 lb 9.6 oz (84.188 kg)  BMI 26.63 kg/m2 Gen: NAD, alert, cooperative with exam HEENT: NCAT, TMs WNL BL, nares with swollen turbinates especially on the right, oropharynx clear, no tenderness to palpation of the frontal or maxillary sinuses Neck: No tender lymphadenopathy CV: RRR, good S1/S2, no murmur Resp: CTABL, no wheezes, non-labored Ext: No edema, warm  Assessment and plan:  Sinusitis, acute frontal Frontal sinusitis with history of recurrent sinusitis Has only been ill for 4 days, ask him to wait 4 more days and try aggressive nasal saline in the meantime. If not better in 4 days start azithromycin Plenty of fluids Return precautions provided    Meds ordered this encounter  Medications  . azithromycin (ZITHROMAX Z-PAK) 250 MG tablet    Sig: As directed    Dispense:  1 each    Refill:  [place], MD [facility] 03/12/2015, 3:52 PM

## 2016-02-17 ENCOUNTER — Encounter: Payer: Self-pay | Admitting: Family Medicine

## 2016-02-17 ENCOUNTER — Encounter: Payer: Self-pay | Admitting: *Deleted

## 2016-02-17 ENCOUNTER — Ambulatory Visit (INDEPENDENT_AMBULATORY_CARE_PROVIDER_SITE_OTHER): Payer: BLUE CROSS/BLUE SHIELD | Admitting: Family Medicine

## 2016-02-17 VITALS — BP 120/73 | HR 85 | Temp 97.7°F | Ht 70.0 in | Wt 194.0 lb

## 2016-02-17 DIAGNOSIS — R112 Nausea with vomiting, unspecified: Secondary | ICD-10-CM | POA: Diagnosis not present

## 2016-02-17 DIAGNOSIS — R6889 Other general symptoms and signs: Secondary | ICD-10-CM

## 2016-02-17 DIAGNOSIS — B349 Viral infection, unspecified: Secondary | ICD-10-CM

## 2016-02-17 DIAGNOSIS — R52 Pain, unspecified: Secondary | ICD-10-CM | POA: Diagnosis not present

## 2016-02-17 DIAGNOSIS — M25512 Pain in left shoulder: Secondary | ICD-10-CM

## 2016-02-17 LAB — CULTURE, GROUP A STREP

## 2016-02-17 LAB — VERITOR FLU A/B WAIVED
INFLUENZA A: NEGATIVE
Influenza B: NEGATIVE

## 2016-02-17 LAB — RAPID STREP SCREEN (MED CTR MEBANE ONLY): STREP GP A AG, IA W/REFLEX: NEGATIVE

## 2016-02-17 NOTE — Patient Instructions (Signed)
Clear liquids for 24 hours (like 7-Up, ginger ale, Sprite, Jello, frozen pops) Full liquids the second 24-hours (like potato soup, tomato soup, chicken noodle soup) Bland diet the third 24-hours (boiled and baked foods, no fried or greasy foods) Avoid milk, cheese, ice cream and dairy products for 72 hours. Avoid caffeine (cola drinks, coffee, tea, Mountain Dew, Mellow Yellow) Take in small amounts, but frequently. Tylenol as needed for aches pains and fever  Remain out of work through the weekend Gradually increase diet We will call with throat culture results as soon as that result becomes available as well as the CBC and BMP results

## 2016-02-17 NOTE — Progress Notes (Signed)
Subjective:    Patient ID: Patrick Ray, male    DOB: 1980/08/14, 35 y.o.   MRN: [id number]  HPI Patient here today for flu like symptoms that started 1 and 1/2 days ago. He is accompanied today by his wife, Patrick Ray. He's had a couple of loose stools a day. He denies any sore throat cough or congestion. He's not having trouble passing his water.     Patient Active Problem List   Diagnosis Date Noted  . Sinusitis, acute frontal 03/12/2015  . Annual physical exam 10/08/2013  . Cerebral palsy, hemiplegic ([place]) 08/09/2013  . Muscle spasm 08/09/2013  . Chest pain, unspecified 08/09/2013  . Seizures ([place])   . Cerebral palsy ([place])   . Forceps delivery    Outpatient Encounter Prescriptions as of 02/17/2016  Medication Sig  . carbamazepine (TEGRETOL XR) 400 MG 12 hr tablet Take 400 mg by mouth 3 (three) times daily after meals.    Marland Kitchen FLUoxetine (PROZAC) 20 MG capsule Take 20 mg by mouth daily.    Marland Kitchen levETIRAcetam (KEPPRA) 500 MG tablet   . Melatonin 10 MG CAPS Take 1 capsule by mouth at bedtime.  . multivitamin (THERAGRAN) per tablet Take 1 tablet by mouth daily.    . calcium citrate-vitamin D 200-200 MG-UNIT TABS Take 1 tablet by mouth daily.    . cholecalciferol (VITAMIN D) 1000 UNITS tablet Take 1,000 Units by mouth daily.    . [DISCONTINUED] azithromycin (ZITHROMAX Z-PAK) 250 MG tablet As directed  . [DISCONTINUED] metaxalone (SKELAXIN) 800 MG tablet Take 1 tablet (800 mg total) by mouth 3 (three) times daily.  . [DISCONTINUED] naproxen (NAPROSYN) 500 MG tablet Take 1 tablet (500 mg total) by mouth 2 (two) times daily with a meal.  . [DISCONTINUED] Omega-3 Fatty Acids (FISH OIL) 300 MG CAPS Take 300 mg by mouth 2 (two) times daily.   No facility-administered encounter medications on file as of 02/17/2016.      Review of Systems  Constitutional: Positive for chills and fever.  HENT: Negative.   Eyes: Negative.   Respiratory: Negative.   Cardiovascular: Negative.     Gastrointestinal: Positive for diarrhea, nausea and vomiting.  Endocrine: Negative.   Genitourinary: Negative.   Musculoskeletal: Positive for myalgias.  Skin: Negative.   Allergic/Immunologic: Negative.   Neurological: Negative.   Hematological: Negative.   Psychiatric/Behavioral: Negative.        Objective:   Physical Exam  Constitutional: He is oriented to person, place, and time. He appears well-developed and well-nourished.  HENT:  Head: Normocephalic and atraumatic.  Right Ear: External ear normal.  Left Ear: External ear normal.  Nose: Nose normal.  Mouth/Throat: No oropharyngeal exudate.  Prominent tonsillar tissue bilaterally  Eyes: Conjunctivae and EOM are normal. Pupils are equal, round, and reactive to light. Right eye exhibits no discharge. Left eye exhibits no discharge. No scleral icterus.  Neck: Normal range of motion. Neck supple. No thyromegaly present.  No anterior cervical adenopathy  Cardiovascular: Normal rate, regular rhythm and normal heart sounds.   No murmur heard. Pulmonary/Chest: Effort normal and breath sounds normal. No respiratory distress. He has no wheezes. He has no rales.  Clear anteriorly and posteriorly  Abdominal: Soft. Bowel sounds are normal. He exhibits distension. He exhibits no mass. There is no tenderness. There is no rebound and no guarding.  No abdominal tenderness but a lot of distention  Musculoskeletal: Normal range of motion. He exhibits no edema.  No specific tenderness and no rash in the left suprascapular  area or the left shoulder.  Lymphadenopathy:    He has no cervical adenopathy.  Neurological: He is alert and oriented to person, place, and time. He has normal reflexes. No cranial nerve deficit.  Skin: Skin is warm and dry. No rash noted.  Psychiatric: He has a normal mood and affect. His behavior is normal. Judgment and thought content normal.  Nursing note and vitals reviewed.  BP 120/73 (BP Location: Right Arm)    Pulse 85   Temp 97.7 F (36.5 C) (Oral)   Ht _0  (1.778 m)   Wt 194 lb (88 kg)   BMI 27.84 kg/m         Assessment & Plan:   1. Flu-like symptoms -Rest fluids and Tylenol. Fluids as directed - Veritor Flu A/B Waived - BMP8+EGFR - CBC with Differential/Platelet - Rapid strep screen (not at [facility])  2. Nausea and vomiting, intractability of vomiting not specified, unspecified vomiting type -Rest fluids Tylenol - BMP8+EGFR - CBC with Differential/Platelet  3. Body aches -Tylenol for aches pains and fever - BMP8+EGFR - CBC with Differential/Platelet  4. Viral syndrome -Per patient instructions  5. Left shoulder pain -When viral symptoms are improved the patient could try Aleve twice daily after breakfast and supper for 10-14 days to see if this provides any improvement in the neck pain and left shoulder pain. If he does not get any relief he should come back to the office for x-rays and further management  Patient Instructions  Clear liquids for 24 hours (like 7-Up, ginger ale, Sprite, Jello, frozen pops) Full liquids the second 24-hours (like potato soup, tomato soup, chicken noodle soup) Bland diet the third 24-hours (boiled and baked foods, no fried or greasy foods) Avoid milk, cheese, ice cream and dairy products for 72 hours. Avoid caffeine (cola drinks, coffee, tea, Mountain Dew, Mellow Yellow) Take in small amounts, but frequently. Tylenol as needed for aches pains and fever  Remain out of work through the weekend Gradually increase diet We will call with throat culture results as soon as that result becomes available as well as the CBC and BMP results   Arrie Senate MD

## 2016-02-18 LAB — BMP8+EGFR
BUN/Creatinine Ratio: 10 (ref 9–20)
BUN: 8 mg/dL (ref 6–20)
CALCIUM: 8.3 mg/dL — AB (ref 8.7–10.2)
CHLORIDE: 100 mmol/L (ref 96–106)
CO2: 23 mmol/L (ref 18–29)
Creatinine, Ser: 0.84 mg/dL (ref 0.76–1.27)
GFR calc non Af Amer: 115 mL/min/{1.73_m2} (ref >59)
GFR, EST AFRICAN AMERICAN: 133 mL/min/{1.73_m2} (ref >59)
GLUCOSE: 111 mg/dL — AB (ref 65–99)
POTASSIUM: 4.5 mmol/L (ref 3.5–5.2)
Sodium: 138 mmol/L (ref 134–144)

## 2016-02-18 LAB — CBC WITH DIFFERENTIAL/PLATELET
BASOS ABS: 0 10*3/uL (ref 0.0–0.2)
Basos: 0 %
EOS (ABSOLUTE): 0.1 10*3/uL (ref 0.0–0.4)
Eos: 3 %
HEMOGLOBIN: 15.4 g/dL (ref 12.6–17.7)
Hematocrit: 46.4 % (ref 37.5–51.0)
IMMATURE GRANS (ABS): 0 10*3/uL (ref 0.0–0.1)
Immature Granulocytes: 0 %
LYMPHS: 27 %
Lymphocytes Absolute: 1.3 10*3/uL (ref 0.7–3.1)
MCH: 30.8 pg (ref 26.6–33.0)
MCHC: 33.2 g/dL (ref 31.5–35.7)
MCV: 93 fL (ref 79–97)
MONOCYTES: 12 %
Monocytes Absolute: 0.6 10*3/uL (ref 0.1–0.9)
NEUTROS ABS: 2.8 10*3/uL (ref 1.4–7.0)
NEUTROS PCT: 58 %
PLATELETS: 184 10*3/uL (ref 150–379)
RBC: 5 x10E6/uL (ref 4.14–5.80)
RDW: 13.3 % (ref 12.3–15.4)
WBC: 4.9 10*3/uL (ref 3.4–10.8)

## 2016-02-19 DIAGNOSIS — R9401 Abnormal electroencephalogram [EEG]: Secondary | ICD-10-CM | POA: Diagnosis not present

## 2016-02-19 DIAGNOSIS — G40209 Localization-related (focal) (partial) symptomatic epilepsy and epileptic syndromes with complex partial seizures, not intractable, without status epilepticus: Secondary | ICD-10-CM | POA: Diagnosis not present

## 2016-02-19 DIAGNOSIS — Z6827 Body mass index (BMI) 27.0-27.9, adult: Secondary | ICD-10-CM | POA: Diagnosis not present

## 2016-02-19 DIAGNOSIS — G809 Cerebral palsy, unspecified: Secondary | ICD-10-CM | POA: Diagnosis not present

## 2016-04-11 ENCOUNTER — Ambulatory Visit (INDEPENDENT_AMBULATORY_CARE_PROVIDER_SITE_OTHER): Payer: BLUE CROSS/BLUE SHIELD | Admitting: Family Medicine

## 2016-04-11 ENCOUNTER — Encounter: Payer: Self-pay | Admitting: Family Medicine

## 2016-04-11 VITALS — BP 132/89 | HR 76 | Temp 97.6°F | Ht 70.0 in | Wt 201.1 lb

## 2016-04-11 DIAGNOSIS — F411 Generalized anxiety disorder: Secondary | ICD-10-CM

## 2016-04-11 DIAGNOSIS — M6248 Contracture of muscle, other site: Secondary | ICD-10-CM

## 2016-04-11 DIAGNOSIS — M62838 Other muscle spasm: Secondary | ICD-10-CM

## 2016-04-11 MED ORDER — VENLAFAXINE HCL ER 37.5 MG PO CP24: 37.5000 mg | ORAL_CAPSULE | Freq: Every day | ORAL | 1 refills | Status: DC

## 2016-04-11 MED ORDER — CYCLOBENZAPRINE HCL 10 MG PO TABS: 10.0000 mg | ORAL_TABLET | Freq: Three times a day (TID) | ORAL | 0 refills | Status: DC | PRN

## 2016-04-11 NOTE — Progress Notes (Signed)
BP 132/89   Pulse 76   Temp 97.6 F (36.4 C) (Oral)   Ht 5\' 10"  (1.778 m)   Wt 201 lb 2 oz (91.2 kg)   BMI 28.86 kg/m    Subjective:    Patient ID: Patrick Ray, male    DOB: 02/23/1981, 35 y.o.   MRN: [id number]  HPI: Patrick Ray is a 35 y.o. 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 The pain is low-grade and 3 out of 10 but he just feels like it's been persistent and keeps coming back and he can't fully get rid of it. He does work at a computer all day and is looking down at his desk but is trying to raise the screen since level with them which is helped some but not completely. He denies any pain in the middle of his neck or any numbness or weakness going down his arm. He has been trying heat and change in his work station at work but he still has this issue.  Anxiety Patient has been on Prozac for many years because anxiety and panic attacks. Most recently he had to cut in half because he was having a lot of sexual side effects from it. He denies any feelings of sadness or depression or suicidal ideations or feelings of hopelessness. Is more just the anxiety and panic attacks and he feels like with the decrease in the Prozac it is really not working anymore but he does not want to increase it again because of the side effects that it had in his relationship. He would like to try something else.  Relevant past medical, surgical, family and social history reviewed and updated as indicated. Interim medical history since our last visit reviewed. Allergies and medications  reviewed and updated.  Review of Systems  Constitutional: Negative for chills and fever.  Eyes: Negative for discharge.  Respiratory: Negative for shortness of breath and wheezing.   Cardiovascular: Negative for chest pain and leg swelling.  Musculoskeletal: Positive for arthralgias. Negative for back pain and gait problem.  Skin: Negative for rash.  Psychiatric/Behavioral: Positive for decreased concentration. Negative for dysphoric mood, self-injury, sleep disturbance and suicidal ideas. The patient is nervous/anxious.   All other systems reviewed and are negative.   Per HPI unless specifically indicated above     Medication List       Accurate as of 04/11/16  5:20 PM. Always use your most recent med list.          calcium citrate-vitamin D 200-200 MG-UNIT Tabs Take 1 tablet by mouth daily.   carbamazepine 400 MG 12 hr tablet Commonly known as:  TEGRETOL XR Take 400 mg by mouth 3 (three) times daily after meals.   cholecalciferol 1000 units tablet Commonly known as:  VITAMIN D Take 1,000 Units by mouth daily.   cyclobenzaprine 10 MG tablet Commonly known as:  FLEXERIL Take 1 tablet (10 mg total) by mouth 3 (three) times daily as needed for muscle spasms.   FLUoxetine 20 MG  capsule Commonly known as:  PROZAC Take 20 mg by mouth daily. Take 1/2 tablet daily   ibuprofen 200 MG tablet Commonly known as:  ADVIL,MOTRIN Take 800 mg by mouth every 6 (six) hours as needed.   levETIRAcetam 500 MG tablet Commonly known as:  KEPPRA   Melatonin 10 MG Caps Take 1 capsule by mouth at bedtime.   multivitamin per tablet Take 1 tablet by mouth daily.   venlafaxine XR 37.5 MG 24 hr capsule Commonly known as:  EFFEXOR XR Take 1 capsule (37.5 mg total) by mouth daily with breakfast.          Objective:    BP 132/89   Pulse 76   Temp 97.6 F (36.4 C) (Oral)   Ht 5\' 10"  (1.778 m)   Wt 201 lb 2 oz (91.2 kg)   BMI 28.86 kg/m   Wt Readings from Last 3 Encounters:    04/11/16 201 lb 2 oz (91.2 kg)  02/17/16 194 lb (88 kg)  03/12/15 185 lb 9.6 oz (84.2 kg)    Physical Exam  Constitutional: He is oriented to person, place, and time. He appears well-developed and well-nourished. No distress.  Eyes: Conjunctivae are normal. Right eye exhibits no discharge. No scleral icterus.  Cardiovascular: Normal rate, regular rhythm, normal heart sounds and intact distal pulses.   No murmur heard. Pulmonary/Chest: Effort normal and breath sounds normal. No respiratory distress. He has no wheezes.  Musculoskeletal: Normal range of motion. He exhibits no edema.       Back:  Neurological: He is alert and oriented to person, place, and time. Coordination normal.  Skin: Skin is warm and dry. No rash noted. He is not diaphoretic.  Psychiatric: His behavior is normal. Judgment and thought content normal. His mood appears anxious. He does not exhibit a depressed mood. He expresses no suicidal ideation. He expresses no suicidal plans.  Nursing note and vitals reviewed.     Assessment & Plan:   Problem List Items Addressed This Visit    None    Visit Diagnoses    Neck muscle spasm    -  Primary   Relevant Medications   cyclobenzaprine (FLEXERIL) 10 MG tablet   Generalized anxiety disorder       Relevant Medications   venlafaxine XR (EFFEXOR XR) 37.5 MG 24 hr capsule       Follow up plan: Return in about 4 weeks (around 05/09/2016), or if symptoms worsen or fail to improve, for Recheck anxiety.  Counseling provided for all of the vaccine components No orders of the defined types were placed in this encounter.   Caryl Pina, MD [facility] 04/11/2016, 5:20 PM

## 2016-05-09 ENCOUNTER — Encounter: Payer: Self-pay | Admitting: Family Medicine

## 2016-05-09 ENCOUNTER — Ambulatory Visit (INDEPENDENT_AMBULATORY_CARE_PROVIDER_SITE_OTHER): Payer: BLUE CROSS/BLUE SHIELD | Admitting: Family Medicine

## 2016-05-09 VITALS — BP 132/77 | HR 76 | Temp 97.0°F | Ht 70.0 in | Wt 204.2 lb

## 2016-05-09 DIAGNOSIS — E785 Hyperlipidemia, unspecified: Secondary | ICD-10-CM | POA: Diagnosis not present

## 2016-05-09 DIAGNOSIS — F411 Generalized anxiety disorder: Secondary | ICD-10-CM | POA: Diagnosis not present

## 2016-05-09 DIAGNOSIS — M62838 Other muscle spasm: Secondary | ICD-10-CM

## 2016-05-09 MED ORDER — CYCLOBENZAPRINE HCL 10 MG PO TABS: 10.0000 mg | ORAL_TABLET | Freq: Three times a day (TID) | ORAL | 5 refills | Status: DC | PRN

## 2016-05-09 MED ORDER — VENLAFAXINE HCL ER 37.5 MG PO CP24: 37.5000 mg | ORAL_CAPSULE | Freq: Every day | ORAL | 5 refills | Status: DC

## 2016-05-09 NOTE — Progress Notes (Signed)
   HPI  Patient presents today for follow-up of anxiety and muscle spasms.  Anxiety Patient was transitioned from fluoxetine to Effexor, he's feeling much better. He denies suicidal thoughts.   Neck muscle spasms helped by Flexeril, taking as needed at night.   Hyperlipidemia. Not watching his diet, unable to exercise due to busyness of life.  We had a  long discussion about strategies for exercise and limiting unhealthy food choices  PMH: Smoking status noted ROS: Per HPI  Objective: BP 132/77   Pulse 76   Temp 97 F (36.1 C) (Oral)   Ht 5\' 10"  (1.778 m)   Wt 204 lb 3.2 oz (92.6 kg)   BMI 29.30 kg/m  Gen: NAD, alert, cooperative with exam HEENT: NCAT CV: RRR, good S1/S2, no murmur Resp: CTABL, no wheezes, non-labored Ext: No edema, warm Neuro: Alert and oriented, hypertonicity of LUE  Psych:  Appropriate mood and affect- No SI  Assessment and plan:  # GAD Doing well, improved Refilled effexor, likely helping neck spasm/pain  # Neck muscle spasm Improved Refill flexeril for PRN use.  Possibly related to hypertonicity of CP   # HLD Discussed at length 2 months of theraputic LIfestyle changes then re-check. May need statin.    Meds ordered this encounter  Medications  . venlafaxine XR (EFFEXOR XR) 37.5 MG 24 hr capsule    Sig: Take 1 capsule (37.5 mg total) by mouth daily with breakfast.    Dispense:  30 capsule    Refill:  5  . cyclobenzaprine (FLEXERIL) 10 MG tablet    Sig: Take 1 tablet (10 mg total) by mouth 3 (three) times daily as needed for muscle spasms.    Dispense:  30 tablet    Refill:  [place], MD [facility] 05/09/2016, 5:02 PM

## 2016-07-11 ENCOUNTER — Encounter: Payer: Self-pay | Admitting: Family Medicine

## 2016-07-11 ENCOUNTER — Ambulatory Visit (INDEPENDENT_AMBULATORY_CARE_PROVIDER_SITE_OTHER): Payer: BLUE CROSS/BLUE SHIELD | Admitting: Family Medicine

## 2016-07-11 VITALS — BP 128/74 | HR 97 | Temp 97.5°F | Ht 70.0 in | Wt 208.2 lb

## 2016-07-11 DIAGNOSIS — E785 Hyperlipidemia, unspecified: Secondary | ICD-10-CM

## 2016-07-11 DIAGNOSIS — F419 Anxiety disorder, unspecified: Secondary | ICD-10-CM | POA: Diagnosis not present

## 2016-07-11 DIAGNOSIS — F938 Other childhood emotional disorders: Secondary | ICD-10-CM | POA: Insufficient documentation

## 2016-07-11 NOTE — Progress Notes (Signed)
   HPI  Patient presents today here for follow-up of  hyperlipidemia, and anxiety.  Hyperlipidemia No family history of cardiac disease He does have family history of elevated cholesterol He is watching his diet more aggressively, however he still eats out most lunches. No formal exercise routine.  Anxiety Doing much better. No suicidal thoughts, tolerating medication easily without side effects.   PMH: Smoking status noted ROS: Per HPI  Objective: BP 128/74   Pulse 97   Temp 97.5 F (36.4 C) (Oral)   Ht 5\' 10"  (1.778 m)   Wt 208 lb 3.2 oz (94.4 kg)   BMI 29.87 kg/m  Gen: NAD, alert, cooperative with exam HEENT: NCAT CV: RRR, good S1/S2, no murmur Resp: CTABL, no wheezes, non-labored Ext: No edema, warm Neuro: Alert and oriented, No gross deficits  Assessment and plan:  # Anxiety  Stable, treated well with Effexor Continue current dose, follow-up 6 months  # hyperlipidemia Discussed at length, repeating labs fasting Given that he's less than 66 years old there is no clear indication for medication. He has no family history of heart disease so I think in this case I would recommend watchful waiting. He is currently taking fish oil 2 capsules per morning, would consider increasing to 2 capsules twice daily    Orders Placed This Encounter  Procedures  . Lipid panel    Standing Status:   Future    Standing Expiration Date:   07/11/2017     Laroy Apple, MD [facility] 07/11/2016, 4:43 PM

## 2016-07-11 NOTE — Patient Instructions (Addendum)
Great to se you!  Come back after fasting for 8 hours ( water is ok) for fasting labs.   Lets plan on seeing you again in 6 months with me or Dr. Warrick Parisian for follow up.

## 2016-07-27 ENCOUNTER — Telehealth: Payer: Self-pay | Admitting: Family Medicine

## 2016-07-27 DIAGNOSIS — E785 Hyperlipidemia, unspecified: Secondary | ICD-10-CM

## 2016-07-27 DIAGNOSIS — F419 Anxiety disorder, unspecified: Secondary | ICD-10-CM | POA: Insufficient documentation

## 2016-07-27 NOTE — Telephone Encounter (Signed)
Please order future labs or let nurse know if we should wait till appointment.

## 2016-07-27 NOTE — Telephone Encounter (Signed)
Aware. Lab orders are in.

## 2016-07-27 NOTE — Telephone Encounter (Signed)
Labs placed  Laroy Apple, MD [facility] 07/27/2016, 3:00 PM

## 2016-08-04 ENCOUNTER — Encounter: Payer: BLUE CROSS/BLUE SHIELD | Admitting: Family Medicine

## 2016-08-17 DIAGNOSIS — R569 Unspecified convulsions: Secondary | ICD-10-CM | POA: Diagnosis not present

## 2016-12-02 ENCOUNTER — Other Ambulatory Visit: Payer: Self-pay | Admitting: Family Medicine

## 2016-12-02 DIAGNOSIS — F411 Generalized anxiety disorder: Secondary | ICD-10-CM

## 2017-01-09 ENCOUNTER — Ambulatory Visit: Payer: BLUE CROSS/BLUE SHIELD | Admitting: Family Medicine

## 2017-01-11 ENCOUNTER — Ambulatory Visit (INDEPENDENT_AMBULATORY_CARE_PROVIDER_SITE_OTHER): Payer: BLUE CROSS/BLUE SHIELD | Admitting: Family Medicine

## 2017-01-11 ENCOUNTER — Encounter: Payer: Self-pay | Admitting: Family Medicine

## 2017-01-11 VITALS — BP 132/71 | HR 77 | Temp 97.0°F | Ht 70.0 in | Wt 197.2 lb

## 2017-01-11 DIAGNOSIS — Z0000 Encounter for general adult medical examination without abnormal findings: Secondary | ICD-10-CM | POA: Diagnosis not present

## 2017-01-11 DIAGNOSIS — E663 Overweight: Secondary | ICD-10-CM

## 2017-01-11 DIAGNOSIS — L989 Disorder of the skin and subcutaneous tissue, unspecified: Secondary | ICD-10-CM

## 2017-01-11 NOTE — Progress Notes (Signed)
   HPI  Patient presents today here for an annual physical exam.  He feels well, he has no complaints except for rash on the right leg.  Rash Present for more than 6 months Right leg, scaly, not itchy Also present behind bilateral ears. Would like to see dermatology, believes this may be psoriasis.  Patient has started working out 3 times a week the last 6 months. He's also watching his diet closely. He feels well overall and is happy with his success, he's lost 11 pounds.  PMH: Smoking status noted ROS: Per HPI  Objective: BP 132/71   Pulse 77   Temp 97 F (36.1 C) (Oral)   Ht '5\' 10"'$  (1.778 m)   Wt 197 lb 3.2 oz (89.4 kg)   BMI 28.30 kg/m  Gen: NAD, alert, cooperative with exam HEENT: NCAT, EOMI, PERRL , TMs normal bilaterally scarring on the right, oropharynx clear, nares clear CV: RRR, good S1/S2, no murmur Resp: CTABL, no wheezes, non-labored Abd: SNTND, BS present, no guarding or organomegaly Ext: No edema, warm Neuro: Alert and oriented, left upper extremity with some unusual flexion and increased tone consistent with cervical causing  Assessment and plan:  # Annual physical exam Normal exam except for overweight, however patient has made positives improvement, congratulated him. Basic labs including thyroid, lipid, CBC, CMP  # Skin lesion Likely psoriasis, refer to dermatology It is not very irritating, patient will wait for treatment     Orders Placed This Encounter  Procedures  . CMP14+EGFR    Standing Status:   Future    Standing Expiration Date:   01/11/2018  . CBC with Differential/Platelet    Standing Status:   Future    Standing Expiration Date:   01/11/2018  . Lipid panel    Standing Status:   Future    Standing Expiration Date:   01/11/2018  . TSH    Standing Status:   Future    Standing Expiration Date:   01/11/2018  . Ambulatory referral to Dermatology    Referral Priority:   Routine    Referral Type:   Consultation    Referral Reason:    Specialty Services Required    Requested Specialty:   Dermatology    Number of Visits Requested:   1    Meds ordered this encounter  Medications  . venlafaxine XR (EFFEXOR-XR) 37.5 MG 24 hr capsule    Sig: Take 1 capsule by mouth every morning.    Refill:  [place], MD [facility] 01/11/2017, 2:40 PM

## 2017-01-11 NOTE — Patient Instructions (Signed)
Great to see you!  Come back for labs fasting.   You will be called for an appointment with Dr. Modena Nunnery.    Health Maintenance, Male A healthy lifestyle and preventive care is important for your health and wellness. Ask your health care provider about what schedule of regular examinations is right for you. What should I know about weight and diet? Eat a Healthy Diet  Eat plenty of vegetables, fruits, whole grains, low-fat dairy products, and lean protein.  Do not eat a lot of foods high in solid fats, added sugars, or salt.  Maintain a Healthy Weight Regular exercise can help you achieve or maintain a healthy weight. You should:  Do at least 150 minutes of exercise each week. The exercise should increase your heart rate and make you sweat (moderate-intensity exercise).  Do strength-training exercises at least twice a week.  Watch Your Levels of Cholesterol and Blood Lipids  Have your blood tested for lipids and cholesterol every 5 years starting at 35 years of age. If you are at high risk for heart disease, you should start having your blood tested when you are 35 years old. You may need to have your cholesterol levels checked more often if: ? Your lipid or cholesterol levels are high. ? You are older than 35 years of age. ? You are at high risk for heart disease.  What should I know about cancer screening? Many types of cancers can be detected early and may often be prevented. Lung Cancer  You should be screened every year for lung cancer if: ? You are a current smoker who has smoked for at least 30 years. ? You are a former smoker who has quit within the past 15 years.  Talk to your health care provider about your screening options, when you should start screening, and how often you should be screened.  Colorectal Cancer  Routine colorectal cancer screening usually begins at 35 years of age and should be repeated every 5-10 years until you are 35 years old. You may need to  be screened more often if early forms of precancerous polyps or small growths are found. Your health care provider may recommend screening at an earlier age if you have risk factors for colon cancer.  Your health care provider may recommend using home test kits to check for hidden blood in the stool.  A small camera at the end of a tube can be used to examine your colon (sigmoidoscopy or colonoscopy). This checks for the earliest forms of colorectal cancer.  Prostate and Testicular Cancer  Depending on your age and overall health, your health care provider may do certain tests to screen for prostate and testicular cancer.  Talk to your health care provider about any symptoms or concerns you have about testicular or prostate cancer.  Skin Cancer  Check your skin from head to toe regularly.  Tell your health care provider about any new moles or changes in moles, especially if: ? There is a change in a mole's size, shape, or color. ? You have a mole that is larger than a pencil eraser.  Always use sunscreen. Apply sunscreen liberally and repeat throughout the day.  Protect yourself by wearing long sleeves, pants, a wide-brimmed hat, and sunglasses when outside.  What should I know about heart disease, diabetes, and high blood pressure?  If you are 6-32 years of age, have your blood pressure checked every 3-5 years. If you are 78 years of age or older, have  your blood pressure checked every year. You should have your blood pressure measured twice-once when you are at a hospital or clinic, and once when you are not at a hospital or clinic. Record the average of the two measurements. To check your blood pressure when you are not at a hospital or clinic, you can use: ? An automated blood pressure machine at a pharmacy. ? A home blood pressure monitor.  Talk to your health care provider about your target blood pressure.  If you are between 36-70 years old, ask your health care provider if  you should take aspirin to prevent heart disease.  Have regular diabetes screenings by checking your fasting blood sugar level. ? If you are at a normal weight and have a low risk for diabetes, have this test once every three years after the age of 61. ? If you are overweight and have a high risk for diabetes, consider being tested at a younger age or more often.  A one-time screening for abdominal aortic aneurysm (AAA) by ultrasound is recommended for men aged 67-75 years who are current or former smokers. What should I know about preventing infection? Hepatitis B If you have a higher risk for hepatitis B, you should be screened for this virus. Talk with your health care provider to find out if you are at risk for hepatitis B infection. Hepatitis C Blood testing is recommended for:  Everyone born from [birth year] through 1964.  Anyone with known risk factors for hepatitis C.  Sexually Transmitted Diseases (STDs)  You should be screened each year for STDs including gonorrhea and chlamydia if: ? You are sexually active and are younger than 35 years of age. ? You are older than 35 years of age and your health care provider tells you that you are at risk for this type of infection. ? Your sexual activity has changed since you were last screened and you are at an increased risk for chlamydia or gonorrhea. Ask your health care provider if you are at risk.  Talk with your health care provider about whether you are at high risk of being infected with HIV. Your health care provider may recommend a prescription medicine to help prevent HIV infection.  What else can I do?  Schedule regular health, dental, and eye exams.  Stay current with your vaccines (immunizations).  Do not use any tobacco products, such as cigarettes, chewing tobacco, and e-cigarettes. If you need help quitting, ask your health care provider.  Limit alcohol intake to no more than 2 drinks per day. One drink equals 12 ounces of  beer, 5 ounces of wine, or 1 ounces of hard liquor.  Do not use street drugs.  Do not share needles.  Ask your health care provider for help if you need support or information about quitting drugs.  Tell your health care provider if you often feel depressed.  Tell your health care provider if you have ever been abused or do not feel safe at home. This information is not intended to replace advice given to you by your health care provider. Make sure you discuss any questions you have with your health care provider. Document Released: 01/07/2008 Document Revised: 03/09/2016 Document Reviewed: 04/14/2015 Elsevier Interactive Patient Education  [employer].

## 2017-01-14 ENCOUNTER — Other Ambulatory Visit: Payer: BLUE CROSS/BLUE SHIELD

## 2017-01-14 DIAGNOSIS — E663 Overweight: Secondary | ICD-10-CM

## 2017-01-14 DIAGNOSIS — Z0000 Encounter for general adult medical examination without abnormal findings: Secondary | ICD-10-CM | POA: Diagnosis not present

## 2017-01-15 LAB — CBC WITH DIFFERENTIAL/PLATELET
BASOS: 1 %
Basophils Absolute: 0 10*3/uL (ref 0.0–0.2)
EOS (ABSOLUTE): 0.2 10*3/uL (ref 0.0–0.4)
Eos: 3 %
Hematocrit: 44.1 % (ref 37.5–51.0)
Hemoglobin: 15 g/dL (ref 13.0–17.7)
Immature Grans (Abs): 0 10*3/uL (ref 0.0–0.1)
Immature Granulocytes: 0 %
LYMPHS ABS: 2 10*3/uL (ref 0.7–3.1)
Lymphs: 41 %
MCH: 31.4 pg (ref 26.6–33.0)
MCHC: 34 g/dL (ref 31.5–35.7)
MCV: 92 fL (ref 79–97)
MONOS ABS: 0.3 10*3/uL (ref 0.1–0.9)
Monocytes: 7 %
Neutrophils Absolute: 2.4 10*3/uL (ref 1.4–7.0)
Neutrophils: 48 %
Platelets: 249 10*3/uL (ref 150–379)
RBC: 4.78 x10E6/uL (ref 4.14–5.80)
RDW: 13.1 % (ref 12.3–15.4)
WBC: 4.9 10*3/uL (ref 3.4–10.8)

## 2017-01-15 LAB — CMP14+EGFR
ALBUMIN: 4.3 g/dL (ref 3.5–5.5)
ALT: 18 IU/L (ref 0–44)
AST: 17 IU/L (ref 0–40)
Albumin/Globulin Ratio: 1.8 (ref 1.2–2.2)
Alkaline Phosphatase: 125 IU/L — ABNORMAL HIGH (ref 39–117)
BUN / CREAT RATIO: 12 (ref 9–20)
BUN: 10 mg/dL (ref 6–20)
Bilirubin Total: 0.3 mg/dL (ref 0.0–1.2)
CALCIUM: 8.9 mg/dL (ref 8.7–10.2)
CO2: 22 mmol/L (ref 20–29)
CREATININE: 0.84 mg/dL (ref 0.76–1.27)
Chloride: 103 mmol/L (ref 96–106)
GFR calc Af Amer: 132 mL/min/{1.73_m2} (ref >59)
GFR, EST NON AFRICAN AMERICAN: 114 mL/min/{1.73_m2} (ref >59)
Globulin, Total: 2.4 g/dL (ref 1.5–4.5)
Glucose: 98 mg/dL (ref 65–99)
Potassium: 4.5 mmol/L (ref 3.5–5.2)
SODIUM: 140 mmol/L (ref 134–144)
Total Protein: 6.7 g/dL (ref 6.0–8.5)

## 2017-01-15 LAB — LIPID PANEL
CHOLESTEROL TOTAL: 264 mg/dL — AB (ref 100–199)
Chol/HDL Ratio: 7.1 ratio — ABNORMAL HIGH (ref 0.0–5.0)
HDL: 37 mg/dL — ABNORMAL LOW (ref >39)
LDL CALC: 186 mg/dL — AB (ref 0–99)
Triglycerides: 205 mg/dL — ABNORMAL HIGH (ref 0–149)
VLDL Cholesterol Cal: 41 mg/dL — ABNORMAL HIGH (ref 5–40)

## 2017-01-15 LAB — TSH: TSH: 1.37 u[IU]/mL (ref 0.450–4.500)

## 2017-01-16 ENCOUNTER — Other Ambulatory Visit: Payer: Self-pay | Admitting: Family Medicine

## 2017-01-16 MED ORDER — ICOSAPENT ETHYL 1 G PO CAPS: 2.0000 g | ORAL_CAPSULE | Freq: Two times a day (BID) | ORAL | 3 refills | Status: DC

## 2017-01-17 ENCOUNTER — Other Ambulatory Visit: Payer: BLUE CROSS/BLUE SHIELD

## 2017-06-12 ENCOUNTER — Other Ambulatory Visit: Payer: Self-pay | Admitting: Family Medicine

## 2017-06-12 DIAGNOSIS — F411 Generalized anxiety disorder: Secondary | ICD-10-CM

## 2017-07-15 ENCOUNTER — Other Ambulatory Visit: Payer: Self-pay | Admitting: Family Medicine

## 2017-07-15 DIAGNOSIS — F411 Generalized anxiety disorder: Secondary | ICD-10-CM

## 2017-07-19 NOTE — Telephone Encounter (Signed)
Has not been seen for GAD in >1 year.  Needs to follow up w/ PCP for this.  Effexor sent x3 months.

## 2017-07-19 NOTE — Telephone Encounter (Signed)
Last seen 01/11/17  Dr D

## 2017-07-20 ENCOUNTER — Other Ambulatory Visit: Payer: Self-pay | Admitting: *Deleted

## 2017-07-31 ENCOUNTER — Telehealth: Payer: Self-pay | Admitting: Family Medicine

## 2017-07-31 DIAGNOSIS — F411 Generalized anxiety disorder: Secondary | ICD-10-CM

## 2017-07-31 MED ORDER — VENLAFAXINE HCL ER 37.5 MG PO CP24: 37.5000 mg | ORAL_CAPSULE | Freq: Every day | ORAL | 0 refills | Status: DC

## 2017-07-31 NOTE — Telephone Encounter (Signed)
Pt aware refill sent OV made for 08/21/17

## 2017-08-17 ENCOUNTER — Encounter: Payer: Self-pay | Admitting: Family Medicine

## 2017-08-17 ENCOUNTER — Ambulatory Visit: Payer: BLUE CROSS/BLUE SHIELD | Admitting: Family Medicine

## 2017-08-17 VITALS — BP 116/74 | HR 102 | Temp 97.6°F | Ht 70.0 in | Wt 197.0 lb

## 2017-08-17 DIAGNOSIS — R6889 Other general symptoms and signs: Secondary | ICD-10-CM | POA: Diagnosis not present

## 2017-08-17 LAB — VERITOR FLU A/B WAIVED
INFLUENZA A: NEGATIVE
INFLUENZA B: NEGATIVE

## 2017-08-17 MED ORDER — OSELTAMIVIR PHOSPHATE 75 MG PO CAPS: 75.0000 mg | ORAL_CAPSULE | Freq: Two times a day (BID) | ORAL | 0 refills | Status: DC

## 2017-08-17 NOTE — Progress Notes (Signed)
BP 116/74   Pulse (!) 102   Temp 97.6 F (36.4 C) (Oral)   Ht 5\' 10"  (1.778 m)   Wt 197 lb (89.4 kg)   BMI 28.27 kg/m    Subjective:    Patient ID: Patrick Ray, male    DOB: 08-Aug-1980, 37 y.o.   MRN: [id number]  HPI: Patrick Ray is a 37 y.o. male presenting on 08/17/2017 for Body aches, congestion, fatigue, runny nose, cough, headache (symptoms began on Monday night, taking Alka Seltzer Cold & Flu, Mucinex)   HPI Cough and congestion and fatigue and runny nose Patient is coming in with complaints of cough and congestion and fatigue and runny nose and headache that is been going on for the past 2-1/2 days.  He denies any fever that he knows of but he has felt febrile and has felt chills.  His cough is been productive.  He has been taking Alka-Seltzer cold and flu and Mucinex which have not been helping much.  Relevant past medical, surgical, family and social history reviewed and updated as indicated. Interim medical history since our last visit reviewed. Allergies and medications reviewed and updated.  Review of Systems  Constitutional: Positive for chills and fever.  HENT: Positive for congestion, postnasal drip, rhinorrhea, sinus pressure, sneezing and sore throat. Negative for ear discharge, ear pain and voice change.   Eyes: Negative for pain, discharge, redness and visual disturbance.  Respiratory: Positive for cough. Negative for shortness of breath and wheezing.   Cardiovascular: Negative for chest pain and leg swelling.  Musculoskeletal: Positive for myalgias. Negative for gait problem.  Skin: Negative for rash.  All other systems reviewed and are negative.   Per HPI unless specifically indicated above   Allergies as of 08/17/2017      Reactions   Penicillins       Medication List        Accurate as of 08/17/17  4:01 PM. Always use your most recent med list.          carbamazepine 400 MG 12 hr tablet Commonly known as:  TEGRETOL XR Take 400 mg by  mouth 3 (three) times daily after meals.   cholecalciferol 1000 units tablet Commonly known as:  VITAMIN D Take 1,000 Units by mouth daily.   Icosapent Ethyl 1 g Caps Commonly known as:  VASCEPA Take 2 g by mouth 2 (two) times daily.   levETIRAcetam 500 MG tablet Commonly known as:  KEPPRA   Melatonin 10 MG Caps Take 1 capsule by mouth at bedtime.   multivitamin per tablet Take 1 tablet by mouth daily.   oseltamivir 75 MG capsule Commonly known as:  TAMIFLU Take 1 capsule (75 mg total) by mouth 2 (two) times daily.   venlafaxine XR 37.5 MG 24 hr capsule Commonly known as:  EFFEXOR-XR Take 1 capsule (37.5 mg total) by mouth daily with breakfast.          Objective:    BP 116/74   Pulse (!) 102   Temp 97.6 F (36.4 C) (Oral)   Ht 5\' 10"  (1.778 m)   Wt 197 lb (89.4 kg)   BMI 28.27 kg/m   Wt Readings from Last 3 Encounters:  08/17/17 197 lb (89.4 kg)  01/11/17 197 lb 3.2 oz (89.4 kg)  07/11/16 208 lb 3.2 oz (94.4 kg)    Physical Exam  Constitutional: He is oriented to person, place, and time. He appears well-developed and well-nourished. No distress.  HENT:  Right Ear:  Tympanic membrane, external ear and ear canal normal.  Left Ear: Tympanic membrane, external ear and ear canal normal.  Nose: Mucosal edema and rhinorrhea present. No sinus tenderness. No epistaxis. Right sinus exhibits maxillary sinus tenderness. Right sinus exhibits no frontal sinus tenderness. Left sinus exhibits maxillary sinus tenderness. Left sinus exhibits no frontal sinus tenderness.  Mouth/Throat: Uvula is midline and mucous membranes are normal. Posterior oropharyngeal edema and posterior oropharyngeal erythema present. No oropharyngeal exudate or tonsillar abscesses.  Eyes: Conjunctivae are normal. No scleral icterus.  Neck: Neck supple. No thyromegaly present.  Cardiovascular: Normal rate, regular rhythm, normal heart sounds and intact distal pulses.  No murmur heard. Pulmonary/Chest:  Effort normal and breath sounds normal. No respiratory distress. He has no wheezes. He has no rales.  Musculoskeletal: Normal range of motion. He exhibits no edema.  Lymphadenopathy:    He has no cervical adenopathy.  Neurological: He is alert and oriented to person, place, and time. Coordination normal.  Skin: Skin is warm and dry. No rash noted. He is not diaphoretic.  Psychiatric: He has a normal mood and affect. His behavior is normal.  Nursing note and vitals reviewed.   Rapid flu negative    Assessment & Plan:   Problem List Items Addressed This Visit    None    Visit Diagnoses    Flu-like symptoms    -  Primary   Relevant Medications   oseltamivir (TAMIFLU) 75 MG capsule   Other Relevant Orders   Veritor Flu A/B Waived (Completed)      Will treat like flu because of symptoms even though flu test is negative. Follow up plan: Return if symptoms worsen or fail to improve.  Counseling provided for all of the vaccine components Orders Placed This Encounter  Procedures  . Veritor Flu A/B [place], MD [facility] 08/17/2017, 4:01 PM

## 2017-08-21 ENCOUNTER — Ambulatory Visit: Payer: BLUE CROSS/BLUE SHIELD | Admitting: Family Medicine

## 2017-08-29 ENCOUNTER — Encounter: Payer: Self-pay | Admitting: Family Medicine

## 2017-08-29 ENCOUNTER — Ambulatory Visit: Payer: BLUE CROSS/BLUE SHIELD | Admitting: Family Medicine

## 2017-08-29 VITALS — BP 131/78 | HR 81 | Temp 97.2°F | Ht 70.0 in | Wt 179.4 lb

## 2017-08-29 DIAGNOSIS — E785 Hyperlipidemia, unspecified: Secondary | ICD-10-CM | POA: Diagnosis not present

## 2017-08-29 DIAGNOSIS — F419 Anxiety disorder, unspecified: Secondary | ICD-10-CM

## 2017-08-29 NOTE — Patient Instructions (Signed)
Great to see you!  I would consider starting livalo, a cholesterol medication if you are interested. The data and studies on this are mixed so I will wait for your new labs first

## 2017-08-29 NOTE — Progress Notes (Signed)
   HPI  Patient presents today to follow-up for hyperlipidemia and anxiety.  Anxiety Doing well with Effexor, no SI. Patient has separated from his wife since her last visit and states that he went through a period of depression and anxiety causing him not to want to eat.  He lost 20 pounds during this time.  Hyperlipidemia He is watching his diet Is lost weight Is exercising. He does not want to take medications He denies any history of premature heart disease in the family  PMH: Smoking status noted ROS: Per HPI  Objective: BP 131/78   Pulse 81   Temp (!) 97.2 F (36.2 C) (Oral)   Ht _0  (1.778 m)   Wt 179 lb 6.4 oz (81.4 kg)   BMI 25.74 kg/m  Gen: NAD, alert, cooperative with exam HEENT: NCAT CV: RRR, good S1/S2, no murmur Resp: CTABL, no wheezes, non-labored Ext: No edema, warm Neuro: Alert and oriented  Assessment and plan:  #Hyperlipidemia Severely elevated LDL at 26, repeating labs today after some weight loss and watching diet. Discussed and offered statins today, however before the age of 37 years old and with out definitive primary hypercholesterolemia I do not see a clear indication. Consider livalo if he is interested in the future  #Doing remarkably well considering the stressful event he is recently gone through Continue Effexor   Orders Placed This Encounter  Procedures  . CMP14+EGFR  . CBC with Differential/Platelet  . LDL Cholesterol, Direct    Laroy Apple, MD [facility] 08/29/2017, 3:39 PM

## 2017-08-30 LAB — CMP14+EGFR
A/G RATIO: 1.7 (ref 1.2–2.2)
ALK PHOS: 107 IU/L (ref 39–117)
ALT: 17 IU/L (ref 0–44)
AST: 17 IU/L (ref 0–40)
Albumin: 4.6 g/dL (ref 3.5–5.5)
BUN/Creatinine Ratio: 10 (ref 9–20)
BUN: 9 mg/dL (ref 6–20)
CO2: 22 mmol/L (ref 20–29)
Calcium: 8.9 mg/dL (ref 8.7–10.2)
Chloride: 103 mmol/L (ref 96–106)
Creatinine, Ser: 0.93 mg/dL (ref 0.76–1.27)
GFR calc non Af Amer: 106 mL/min/{1.73_m2} (ref >59)
GFR, EST AFRICAN AMERICAN: 123 mL/min/{1.73_m2} (ref >59)
GLUCOSE: 95 mg/dL (ref 65–99)
Globulin, Total: 2.7 g/dL (ref 1.5–4.5)
POTASSIUM: 4.5 mmol/L (ref 3.5–5.2)
Sodium: 142 mmol/L (ref 134–144)
TOTAL PROTEIN: 7.3 g/dL (ref 6.0–8.5)

## 2017-08-30 LAB — CBC WITH DIFFERENTIAL/PLATELET
Basophils Absolute: 0.1 10*3/uL (ref 0.0–0.2)
Basos: 1 %
EOS (ABSOLUTE): 0.1 10*3/uL (ref 0.0–0.4)
Eos: 2 %
HEMOGLOBIN: 14.5 g/dL (ref 13.0–17.7)
Hematocrit: 42.2 % (ref 37.5–51.0)
IMMATURE GRANS (ABS): 0 10*3/uL (ref 0.0–0.1)
Immature Granulocytes: 0 %
LYMPHS: 32 %
Lymphocytes Absolute: 2 10*3/uL (ref 0.7–3.1)
MCH: 31.7 pg (ref 26.6–33.0)
MCHC: 34.4 g/dL (ref 31.5–35.7)
MCV: 92 fL (ref 79–97)
MONOCYTES: 7 %
Monocytes Absolute: 0.4 10*3/uL (ref 0.1–0.9)
NEUTROS ABS: 3.7 10*3/uL (ref 1.4–7.0)
NEUTROS PCT: 58 %
PLATELETS: 271 10*3/uL (ref 150–379)
RBC: 4.58 x10E6/uL (ref 4.14–5.80)
RDW: 13.9 % (ref 12.3–15.4)
WBC: 6.4 10*3/uL (ref 3.4–10.8)

## 2017-08-30 LAB — LDL CHOLESTEROL, DIRECT: LDL DIRECT: 205 mg/dL — AB (ref 0–99)

## 2017-09-19 ENCOUNTER — Encounter: Payer: Self-pay | Admitting: *Deleted

## 2017-09-19 MED ORDER — PITAVASTATIN CALCIUM 2 MG PO TABS: 2.0000 mg | ORAL_TABLET | Freq: Every day | ORAL | 6 refills | Status: DC

## 2017-10-10 DIAGNOSIS — Z85828 Personal history of other malignant neoplasm of skin: Secondary | ICD-10-CM | POA: Diagnosis not present

## 2017-10-10 DIAGNOSIS — L409 Psoriasis, unspecified: Secondary | ICD-10-CM | POA: Diagnosis not present

## 2017-10-10 DIAGNOSIS — L570 Actinic keratosis: Secondary | ICD-10-CM | POA: Diagnosis not present

## 2017-10-13 ENCOUNTER — Other Ambulatory Visit: Payer: Self-pay | Admitting: Family Medicine

## 2017-10-13 DIAGNOSIS — F411 Generalized anxiety disorder: Secondary | ICD-10-CM

## 2018-01-04 ENCOUNTER — Other Ambulatory Visit: Payer: Self-pay | Admitting: Family Medicine

## 2018-01-04 DIAGNOSIS — F411 Generalized anxiety disorder: Secondary | ICD-10-CM

## 2018-04-04 ENCOUNTER — Other Ambulatory Visit: Payer: Self-pay | Admitting: Family Medicine

## 2018-04-04 DIAGNOSIS — F411 Generalized anxiety disorder: Secondary | ICD-10-CM

## 2018-04-10 DIAGNOSIS — L409 Psoriasis, unspecified: Secondary | ICD-10-CM | POA: Diagnosis not present

## 2018-04-10 DIAGNOSIS — L570 Actinic keratosis: Secondary | ICD-10-CM | POA: Diagnosis not present

## 2018-04-10 DIAGNOSIS — L905 Scar conditions and fibrosis of skin: Secondary | ICD-10-CM | POA: Diagnosis not present

## 2018-04-10 DIAGNOSIS — D2261 Melanocytic nevi of right upper limb, including shoulder: Secondary | ICD-10-CM | POA: Diagnosis not present

## 2018-04-10 DIAGNOSIS — Z85828 Personal history of other malignant neoplasm of skin: Secondary | ICD-10-CM | POA: Diagnosis not present

## 2018-04-10 DIAGNOSIS — D485 Neoplasm of uncertain behavior of skin: Secondary | ICD-10-CM | POA: Diagnosis not present

## 2018-04-19 ENCOUNTER — Other Ambulatory Visit: Payer: Self-pay | Admitting: Family Medicine

## 2018-04-19 DIAGNOSIS — F411 Generalized anxiety disorder: Secondary | ICD-10-CM

## 2018-04-19 NOTE — Telephone Encounter (Signed)
lmtcb

## 2018-04-19 NOTE — Telephone Encounter (Signed)
Dettinger. NTBS 30 days given 04/04/18

## 2018-06-25 ENCOUNTER — Encounter: Payer: Self-pay | Admitting: Family Medicine

## 2018-06-25 ENCOUNTER — Ambulatory Visit: Payer: BLUE CROSS/BLUE SHIELD | Admitting: Family Medicine

## 2018-06-25 VITALS — BP 137/87 | HR 84 | Temp 97.7°F | Ht 70.0 in | Wt 200.6 lb

## 2018-06-25 DIAGNOSIS — F411 Generalized anxiety disorder: Secondary | ICD-10-CM

## 2018-06-25 MED ORDER — VENLAFAXINE HCL ER 37.5 MG PO CP24: 37.5000 mg | ORAL_CAPSULE | Freq: Every day | ORAL | 0 refills | Status: DC

## 2018-06-25 MED ORDER — VENLAFAXINE HCL ER 37.5 MG PO CP24: 37.5000 mg | ORAL_CAPSULE | Freq: Every day | ORAL | 3 refills | Status: DC

## 2018-06-25 NOTE — Progress Notes (Signed)
BP 137/87   Pulse 84   Temp 97.7 F (36.5 C) (Oral)   Ht 5\' 10"  (1.778 m)   Wt 200 lb 9.6 oz (91 kg)   BMI 28.78 kg/m    Subjective:    Patient ID: Patrick Ray, male    DOB: [birth year], 37 y.o.   MRN: [id number]  HPI: Patrick Ray is a 37 y.o. male presenting on 06/25/2018 for Anxiety (follow up)   HPI Anxiety Patient is coming in for an anxiety check, patient has been on the current dose of Effexor and has gotten to the point where he has been doing so well and does not feel like he needs it wants to taper down off the medication.  He denies any major feelings of depression or anxiety.  Patient denies any suicidal ideations.  He says it was started at a period of time when there was a major loss in his family. Depression screen [facility] 2/9 06/25/2018 08/29/2017 08/17/2017 01/11/2017 07/11/2016  Decreased Interest 0 0 0 0 0  Down, Depressed, Hopeless 1 0 0 0 0  PHQ - 2 Score 1 0 0 0 0     Relevant past medical, surgical, family and social history reviewed and updated as indicated. Interim medical history since our last visit reviewed. Allergies and medications reviewed and updated.  Review of Systems  Constitutional: Negative for chills and fever.  Respiratory: Negative for shortness of breath and wheezing.   Cardiovascular: Negative for chest pain and leg swelling.  Musculoskeletal: Negative for back pain and gait problem.  Skin: Negative for rash.  Psychiatric/Behavioral: Negative for decreased concentration, dysphoric mood, self-injury, sleep disturbance and suicidal ideas. The patient is not nervous/anxious.   All other systems reviewed and are negative.   Per HPI unless specifically indicated above   Allergies as of 06/25/2018      Reactions   Penicillins       Medication List        Accurate as of 06/25/18  4:18 PM. Always use your most recent med list.          carbamazepine 400 MG 12 hr tablet Commonly known as:  TEGRETOL XR Take 400 mg by mouth 3 (three)  times daily after meals.   cholecalciferol 1000 units tablet Commonly known as:  VITAMIN D Take 1,000 Units by mouth daily.   levETIRAcetam 500 MG tablet Commonly known as:  KEPPRA   Melatonin 10 MG Caps Take 1 capsule by mouth at bedtime.   multivitamin per tablet Take 1 tablet by mouth daily.   Pitavastatin Calcium 2 MG Tabs Take 1 tablet (2 mg total) by mouth daily.   venlafaxine XR 37.5 MG 24 hr capsule Commonly known as:  EFFEXOR-XR Take 1 capsule (37.5 mg total) by mouth daily with breakfast. (Needs to be seen)          Objective:    BP 137/87   Pulse 84   Temp 97.7 F (36.5 C) (Oral)   Ht 5\' 10"  (1.778 m)   Wt 200 lb 9.6 oz (91 kg)   BMI 28.78 kg/m   Wt Readings from Last 3 Encounters:  06/25/18 200 lb 9.6 oz (91 kg)  08/29/17 179 lb 6.4 oz (81.4 kg)  08/17/17 197 lb (89.4 kg)    Physical Exam  Constitutional: He is oriented to person, place, and time. He appears well-developed and well-nourished. No distress.  Eyes: Conjunctivae are normal. No scleral icterus.  Neck: Neck supple. No thyromegaly present.  Cardiovascular: Normal rate, regular rhythm, normal heart sounds and intact distal pulses.  No murmur heard. Pulmonary/Chest: Effort normal and breath sounds normal. No respiratory distress. He has no wheezes.  Musculoskeletal: Normal range of motion. He exhibits no edema.  Lymphadenopathy:    He has no cervical adenopathy.  Neurological: He is alert and oriented to person, place, and time. Coordination normal.  Skin: Skin is warm and dry. No rash noted. He is not diaphoretic.  Psychiatric: He has a normal mood and affect. His behavior is normal. His mood appears not anxious. He does not exhibit a depressed mood. He expresses no suicidal ideation. He expresses no suicidal plans.  Nursing note and vitals reviewed.       Assessment & Plan:   Problem List Items Addressed This Visit    None    Visit Diagnoses    Generalized anxiety disorder    -   Primary   Relevant Medications   venlafaxine XR (EFFEXOR-XR) 37.5 MG 24 hr capsule   venlafaxine XR (EFFEXOR XR) 37.5 MG 24 hr capsule       Follow up plan: Return in about 3 months (around 09/24/2018), or if symptoms worsen or fail to improve, for anxiety.  Counseling provided for all of the vaccine components No orders of the defined types were placed in this encounter.   Caryl Pina, MD [facility] 06/25/2018, 4:18 PM

## 2018-08-30 ENCOUNTER — Ambulatory Visit: Payer: BLUE CROSS/BLUE SHIELD | Admitting: Family Medicine

## 2018-08-30 ENCOUNTER — Encounter: Payer: Self-pay | Admitting: Family Medicine

## 2018-08-30 VITALS — BP 132/77 | HR 109 | Temp 100.6°F | Ht 70.0 in | Wt 202.0 lb

## 2018-08-30 DIAGNOSIS — R509 Fever, unspecified: Secondary | ICD-10-CM | POA: Diagnosis not present

## 2018-08-30 DIAGNOSIS — J101 Influenza due to other identified influenza virus with other respiratory manifestations: Secondary | ICD-10-CM | POA: Diagnosis not present

## 2018-08-30 LAB — CULTURE, GROUP A STREP

## 2018-08-30 LAB — VERITOR FLU A/B WAIVED
INFLUENZA B: NEGATIVE
Influenza A: POSITIVE — AB

## 2018-08-30 LAB — RAPID STREP SCREEN (MED CTR MEBANE ONLY): Strep Gp A Ag, IA W/Reflex: NEGATIVE

## 2018-08-30 MED ORDER — OSELTAMIVIR PHOSPHATE 75 MG PO CAPS: 75.0000 mg | ORAL_CAPSULE | Freq: Two times a day (BID) | ORAL | 0 refills | Status: AC

## 2018-08-30 MED ORDER — BENZONATATE 100 MG PO CAPS: 100.0000 mg | ORAL_CAPSULE | Freq: Three times a day (TID) | ORAL | 0 refills | Status: DC | PRN

## 2018-08-30 NOTE — Patient Instructions (Signed)
Strep negative.  Influenza, Adult Influenza is also called "the flu." It is an infection in the lungs, nose, and throat (respiratory tract). It is caused by a virus. The flu causes symptoms that are similar to symptoms of a cold. It also causes a high fever and body aches. The flu spreads easily from person to person (is contagious). Getting a flu shot (influenza vaccination) every year is the best way to prevent the flu. What are the causes? This condition is caused by the influenza virus. You can get the virus by:  Breathing in droplets that are in the air from the cough or sneeze of a person who has the virus.  Touching something that has the virus on it (is contaminated) and then touching your mouth, nose, or eyes. What increases the risk? Certain things may make you more likely to get the flu. These include:  Not washing your hands often.  Having close contact with many people during cold and flu season.  Touching your mouth, eyes, or nose without first washing your hands.  Not getting a flu shot every year. You may have a higher risk for the flu, along with serious problems such as a lung infection (pneumonia), if you:  Are older than 65.  Are pregnant.  Have a weakened disease-fighting system (immune system) because of a disease or taking certain medicines.  Have a long-term (chronic) illness, such as: ? Heart, kidney, or lung disease. ? Diabetes. ? Asthma.  Have a liver disorder.  Are very overweight (morbidly obese).  Have anemia. This is a condition that affects your red blood cells. What are the signs or symptoms? Symptoms usually begin suddenly and last 4-14 days. They may include:  Fever and chills.  Headaches, body aches, or muscle aches.  Sore throat.  Cough.  Runny or stuffy (congested) nose.  Chest discomfort.  Not wanting to eat as much as normal (poor appetite).  Weakness or feeling tired (fatigue).  Dizziness.  Feeling sick to your  stomach (nauseous) or throwing up (vomiting). How is this treated? If the flu is found early, you can be treated with medicine that can help reduce how bad the illness is and how long it lasts (antiviral medicine). This may be given by mouth (orally) or through an IV tube. Taking care of yourself at home can help your symptoms get better. Your doctor may suggest:  Taking over-the-counter medicines.  Drinking plenty of fluids. The flu often goes away on its own. If you have very bad symptoms or other problems, you may be treated in a hospital. Follow these instructions at home:     Activity  Rest as needed. Get plenty of sleep.  Stay home from work or school as told by your doctor. ? Do not leave home until you do not have a fever for 24 hours without taking medicine. ? Leave home only to visit your doctor. Eating and drinking  Take an ORS (oral rehydration solution). This is a drink that is sold at pharmacies and stores.  Drink enough fluid to keep your pee (urine) pale yellow.  Drink clear fluids in small amounts as you are able. Clear fluids include: ? Water. ? Ice chips. ? Fruit juice that has water added (diluted fruit juice). ? Low-calorie sports drinks.  Eat bland, easy-to-digest foods in small amounts as you are able. These foods include: ? Bananas. ? Applesauce. ? Rice. ? Lean meats. ? Toast. ? Crackers.  Do not eat or drink: ? Fluids that  have a lot of sugar or caffeine. ? Alcohol. ? Spicy or fatty foods. General instructions  Take over-the-counter and prescription medicines only as told by your doctor.  Use a cool mist humidifier to add moisture to the air in your home. This can make it easier for you to breathe.  Cover your mouth and nose when you cough or sneeze.  Wash your hands with soap and water often, especially after you cough or sneeze. If you cannot use soap and water, use alcohol-based hand sanitizer.  Keep all follow-up visits as told by  your doctor. This is important. How is this prevented?   Get a flu shot every year. You may get the flu shot in late summer, fall, or winter. Ask your doctor when you should get your flu shot.  Avoid contact with people who are sick during fall and winter (cold and flu season). Contact a doctor if:  You get new symptoms.  You have: ? Chest pain. ? Watery poop (diarrhea). ? A fever.  Your cough gets worse.  You start to have more mucus.  You feel sick to your stomach.  You throw up. Get help right away if you:  Have shortness of breath.  Have trouble breathing.  Have skin or nails that turn a bluish color.  Have very bad pain or stiffness in your neck.  Get a sudden headache.  Get sudden pain in your face or ear.  Cannot eat or drink without throwing up. Summary  Influenza ("the flu") is an infection in the lungs, nose, and throat. It is caused by a virus.  Take over-the-counter and prescription medicines only as told by your doctor.  Getting a flu shot every year is the best way to avoid getting the flu. This information is not intended to replace advice given to you by your health care provider. Make sure you discuss any questions you have with your health care provider. Document Released: 04/19/2008 Document Revised: 12/27/2017 Document Reviewed: 12/27/2017 Elsevier Interactive Patient Education  2019 [employer].

## 2018-08-30 NOTE — Progress Notes (Signed)
Subjective: CC: Flu like symptoms PCP: Dettinger, Fransisca Kaufmann, MD Patrick Ray is a 38 y.o. male presenting to clinic today for:  1.Flu like symptoms Patient reports sudden onset of severe cough, chills and headache this morning.  He notes he had a very mild sore throat a very mild cough yesterday evening but when he woke up around 4 AM symptoms have become much more severe.  He reports associated myalgia and mild dizziness.  Denies any nausea, vomiting or diarrhea.  No hemoptysis.  No shortness of breath or wheeze.  He has been using Advil Cold and Sinus as well as Mucinex DM with little improvement in symptoms.  He did get his flu shot this year.   ROS: Per HPI  Allergies  Allergen Reactions  . Penicillins    Past Medical History:  Diagnosis Date  . Anxiety   . Cerebral palsy ([place])   . Cerebral palsy ([place])   . Forceps delivery   . Seizure ([place])   . Seizures ([place])     Current Outpatient Medications:  .  carbamazepine (TEGRETOL XR) 400 MG 12 hr tablet, Take 400 mg by mouth 3 (three) times daily after meals.  , Disp: , Rfl:  .  cholecalciferol (VITAMIN D) 1000 UNITS tablet, Take 1,000 Units by mouth daily.  , Disp: , Rfl:  .  levETIRAcetam (KEPPRA) 500 MG tablet, , Disp: , Rfl:  .  Melatonin 10 MG CAPS, Take 1 capsule by mouth at bedtime., Disp: , Rfl:  .  multivitamin (THERAGRAN) per tablet, Take 1 tablet by mouth daily.  , Disp: , Rfl:  .  Pitavastatin Calcium (LIVALO) 2 MG TABS, Take 1 tablet (2 mg total) by mouth daily., Disp: 30 tablet, Rfl: 6 .  venlafaxine XR (EFFEXOR XR) 37.5 MG 24 hr capsule, Take 1 capsule (37.5 mg total) by mouth daily with breakfast., Disp: 14 capsule, Rfl: 0 .  venlafaxine XR (EFFEXOR-XR) 37.5 MG 24 hr capsule, Take 1 capsule (37.5 mg total) by mouth daily with breakfast. (Needs to be seen), Disp: 90 capsule, Rfl: 3 Social History   Socioeconomic History  . Marital status: Married    Spouse name: Not on file  . Number of children: Not on file    . Years of education: Not on file  . Highest education level: Not on file  Occupational History  . Not on file  Social Needs  . Financial resource strain: Not on file  . Food insecurity:    Worry: Not on file    Inability: Not on file  . Transportation needs:    Medical: Not on file    Non-medical: Not on file  Tobacco Use  . Smoking status: Former Smoker    Last attempt to quit: 12/17/2015    Years since quitting: 2.7  . Smokeless tobacco: Never Used  Substance and Sexual Activity  . Alcohol use: No  . Drug use: Not on file  . Sexual activity: Not on file  Lifestyle  . Physical activity:    Days per week: Not on file    Minutes per session: Not on file  . Stress: Not on file  Relationships  . Social connections:    Talks on phone: Not on file    Gets together: Not on file    Attends religious service: Not on file    Active member of club or organization: Not on file    Attends meetings of clubs or organizations: Not on file    Relationship  status: Not on file  . Intimate partner violence:    Fear of current or ex partner: Not on file    Emotionally abused: Not on file    Physically abused: Not on file    Forced sexual activity: Not on file  Other Topics Concern  . Not on file  Social History Narrative  . Not on file   No family history on file.  Objective: Office vital signs reviewed. BP 132/77   Pulse (!) 109   Temp (!) 100.6 F (38.1 C) (Oral)   Ht 5\' 10"  (1.778 m)   Wt 202 lb (91.6 kg)   SpO2 96%   BMI 28.98 kg/m   Physical Examination:  General: Awake, alert, ill appearing, No acute distress HEENT: Normal    Neck: No masses palpated. No lymphadenopathy    Ears: Tympanic membranes intact, normal light reflex, no erythema, no bulging    Eyes: PERRLA, extraocular membranes intact, sclera white    Nose: nasal turbinates moist, clear nasal discharge    Throat: moist mucus membranes, no erythema, no tonsillar exudate.  Airway is patent Cardio: regular  rate and rhythm, S1S2 heard, no murmurs appreciated Pulm: clear to auscultation bilaterally, no wheezes, rhonchi or rales; normal work of breathing on room air  Assessment/ Plan: 38 y.o. male   1. Influenza A Patient febrile and ill-appearing during exam.  Physical exam was fairly unremarkable.  Rapid flu was positive for influenza A.  Start Tamiflu.  Tessalon Perles prescribed.  Push oral fluids.  Patient is off of work until Sunday but may need additional days off.  He will contact the office for a note if needed.  2. Fever, unspecified fever cause As above. - Veritor Flu A/B Waived - Rapid Strep Screen ([facility] ONLY)   Orders Placed This Encounter  Procedures  . Rapid Strep Screen ([facility] ONLY)  . Veritor Flu A/B Waived    Order Specific Question:   Source    Answer:   nasal   Meds ordered this encounter  Medications  . oseltamivir (TAMIFLU) 75 MG capsule    Sig: Take 1 capsule (75 mg total) by mouth 2 (two) times daily for 5 days.    Dispense:  10 capsule    Refill:  0  . benzonatate (TESSALON PERLES) 100 MG capsule    Sig: Take 1 capsule (100 mg total) by mouth 3 (three) times daily as needed.    Dispense:  20 capsule    Refill:  [place], DO [place] [phone]

## 2018-10-09 DIAGNOSIS — L738 Other specified follicular disorders: Secondary | ICD-10-CM | POA: Diagnosis not present

## 2018-10-09 DIAGNOSIS — L280 Lichen simplex chronicus: Secondary | ICD-10-CM | POA: Diagnosis not present

## 2018-10-09 DIAGNOSIS — L281 Prurigo nodularis: Secondary | ICD-10-CM | POA: Diagnosis not present

## 2018-10-09 DIAGNOSIS — Z85828 Personal history of other malignant neoplasm of skin: Secondary | ICD-10-CM | POA: Diagnosis not present

## 2018-10-09 DIAGNOSIS — D485 Neoplasm of uncertain behavior of skin: Secondary | ICD-10-CM | POA: Diagnosis not present

## 2019-02-11 DIAGNOSIS — G40209 Localization-related (focal) (partial) symptomatic epilepsy and epileptic syndromes with complex partial seizures, not intractable, without status epilepticus: Secondary | ICD-10-CM | POA: Diagnosis not present

## 2019-02-11 DIAGNOSIS — Z79899 Other long term (current) drug therapy: Secondary | ICD-10-CM | POA: Diagnosis not present

## 2019-02-11 DIAGNOSIS — F329 Major depressive disorder, single episode, unspecified: Secondary | ICD-10-CM | POA: Diagnosis not present

## 2019-04-10 DIAGNOSIS — L739 Follicular disorder, unspecified: Secondary | ICD-10-CM | POA: Diagnosis not present

## 2019-04-10 DIAGNOSIS — L570 Actinic keratosis: Secondary | ICD-10-CM | POA: Diagnosis not present

## 2019-04-10 DIAGNOSIS — L409 Psoriasis, unspecified: Secondary | ICD-10-CM | POA: Diagnosis not present

## 2019-04-10 DIAGNOSIS — D485 Neoplasm of uncertain behavior of skin: Secondary | ICD-10-CM | POA: Diagnosis not present

## 2019-04-10 DIAGNOSIS — Z85828 Personal history of other malignant neoplasm of skin: Secondary | ICD-10-CM | POA: Diagnosis not present

## 2019-04-22 ENCOUNTER — Other Ambulatory Visit: Payer: Self-pay

## 2019-04-22 ENCOUNTER — Other Ambulatory Visit: Payer: BC Managed Care – PPO

## 2019-04-22 DIAGNOSIS — Z79899 Other long term (current) drug therapy: Secondary | ICD-10-CM | POA: Diagnosis not present

## 2019-04-22 DIAGNOSIS — G40209 Localization-related (focal) (partial) symptomatic epilepsy and epileptic syndromes with complex partial seizures, not intractable, without status epilepticus: Secondary | ICD-10-CM | POA: Diagnosis not present

## 2019-05-19 ENCOUNTER — Encounter (HOSPITAL_COMMUNITY): Payer: Self-pay

## 2019-05-19 ENCOUNTER — Other Ambulatory Visit: Payer: Self-pay

## 2019-05-19 ENCOUNTER — Observation Stay (HOSPITAL_COMMUNITY)
Admission: EM | Admit: 2019-05-19 | Discharge: 2019-05-21 | Disposition: A | Discharge status: Home / Self Care | Payer: BC Managed Care – PPO | Attending: General Surgery | Admitting: General Surgery

## 2019-05-19 ENCOUNTER — Emergency Department (HOSPITAL_COMMUNITY): Payer: BC Managed Care – PPO

## 2019-05-19 ENCOUNTER — Emergency Department (HOSPITAL_COMMUNITY)
Admit: 2019-05-19 | Discharge: 2019-05-19 | Disposition: A | Discharge status: Home / Self Care | Payer: BC Managed Care – PPO | Attending: Emergency Medicine | Admitting: Emergency Medicine

## 2019-05-19 DIAGNOSIS — Z87891 Personal history of nicotine dependence: Secondary | ICD-10-CM | POA: Insufficient documentation

## 2019-05-19 DIAGNOSIS — R1011 Right upper quadrant pain: Secondary | ICD-10-CM | POA: Diagnosis not present

## 2019-05-19 DIAGNOSIS — K8012 Calculus of gallbladder with acute and chronic cholecystitis without obstruction: Principal | ICD-10-CM | POA: Insufficient documentation

## 2019-05-19 DIAGNOSIS — Z20828 Contact with and (suspected) exposure to other viral communicable diseases: Secondary | ICD-10-CM | POA: Insufficient documentation

## 2019-05-19 DIAGNOSIS — G808 Other cerebral palsy: Secondary | ICD-10-CM | POA: Diagnosis not present

## 2019-05-19 DIAGNOSIS — E785 Hyperlipidemia, unspecified: Secondary | ICD-10-CM | POA: Diagnosis not present

## 2019-05-19 DIAGNOSIS — K8020 Calculus of gallbladder without cholecystitis without obstruction: Secondary | ICD-10-CM | POA: Diagnosis not present

## 2019-05-19 DIAGNOSIS — D3502 Benign neoplasm of left adrenal gland: Secondary | ICD-10-CM | POA: Diagnosis not present

## 2019-05-19 DIAGNOSIS — F419 Anxiety disorder, unspecified: Secondary | ICD-10-CM | POA: Insufficient documentation

## 2019-05-19 DIAGNOSIS — R52 Pain, unspecified: Secondary | ICD-10-CM | POA: Diagnosis not present

## 2019-05-19 DIAGNOSIS — K810 Acute cholecystitis: Secondary | ICD-10-CM | POA: Diagnosis not present

## 2019-05-19 DIAGNOSIS — R000 Tachycardia, unspecified: Secondary | ICD-10-CM | POA: Diagnosis not present

## 2019-05-19 DIAGNOSIS — Z79899 Other long term (current) drug therapy: Secondary | ICD-10-CM | POA: Diagnosis not present

## 2019-05-19 DIAGNOSIS — K760 Fatty (change of) liver, not elsewhere classified: Secondary | ICD-10-CM | POA: Diagnosis not present

## 2019-05-19 DIAGNOSIS — R569 Unspecified convulsions: Secondary | ICD-10-CM | POA: Insufficient documentation

## 2019-05-19 DIAGNOSIS — R1084 Generalized abdominal pain: Secondary | ICD-10-CM | POA: Diagnosis not present

## 2019-05-19 DIAGNOSIS — R0689 Other abnormalities of breathing: Secondary | ICD-10-CM | POA: Diagnosis not present

## 2019-05-19 DIAGNOSIS — Z880 Allergy status to penicillin: Secondary | ICD-10-CM | POA: Insufficient documentation

## 2019-05-19 LAB — CBC WITH DIFFERENTIAL/PLATELET
Abs Immature Granulocytes: 0.01 10*3/uL (ref 0.00–0.07)
Basophils Absolute: 0.1 10*3/uL (ref 0.0–0.1)
Basophils Relative: 1 %
Eosinophils Absolute: 0.1 10*3/uL (ref 0.0–0.5)
Eosinophils Relative: 1 %
HCT: 45.6 % (ref 39.0–52.0)
Hemoglobin: 15.7 g/dL (ref 13.0–17.0)
Immature Granulocytes: 0 %
Lymphocytes Relative: 18 %
Lymphs Abs: 1.6 10*3/uL (ref 0.7–4.0)
MCH: 31.6 pg (ref 26.0–34.0)
MCHC: 34.4 g/dL (ref 30.0–36.0)
MCV: 91.8 fL (ref 80.0–100.0)
Monocytes Absolute: 0.5 10*3/uL (ref 0.1–1.0)
Monocytes Relative: 6 %
Neutro Abs: 6.8 10*3/uL (ref 1.7–7.7)
Neutrophils Relative %: 74 %
Platelets: 239 10*3/uL (ref 150–400)
RBC: 4.97 MIL/uL (ref 4.22–5.81)
RDW: 11.7 % (ref 11.5–15.5)
WBC: 9.2 10*3/uL (ref 4.0–10.5)
nRBC: 0 % (ref 0.0–0.2)

## 2019-05-19 LAB — COMPREHENSIVE METABOLIC PANEL
ALT: 25 U/L (ref 0–44)
AST: 21 U/L (ref 15–41)
Albumin: 4.1 g/dL (ref 3.5–5.0)
Alkaline Phosphatase: 91 U/L (ref 38–126)
Anion gap: 8 (ref 5–15)
BUN: 15 mg/dL (ref 6–20)
CO2: 22 mmol/L (ref 22–32)
Calcium: 8.4 mg/dL — ABNORMAL LOW (ref 8.9–10.3)
Chloride: 108 mmol/L (ref 98–111)
Creatinine, Ser: 0.73 mg/dL (ref 0.61–1.24)
GFR calc Af Amer: >60 mL/min (ref >60)
GFR calc non Af Amer: >60 mL/min (ref >60)
Glucose, Bld: 107 mg/dL — ABNORMAL HIGH (ref 70–99)
Potassium: 3.8 mmol/L (ref 3.5–5.1)
Sodium: 138 mmol/L (ref 135–145)
Total Bilirubin: 0.9 mg/dL (ref 0.3–1.2)
Total Protein: 7.1 g/dL (ref 6.5–8.1)

## 2019-05-19 LAB — URINALYSIS, ROUTINE W REFLEX MICROSCOPIC
Bilirubin Urine: NEGATIVE
Glucose, UA: NEGATIVE mg/dL
Hgb urine dipstick: NEGATIVE
Ketones, ur: NEGATIVE mg/dL
Leukocytes,Ua: NEGATIVE
Nitrite: NEGATIVE
Protein, ur: NEGATIVE mg/dL
Specific Gravity, Urine: >1.046 — ABNORMAL HIGH (ref 1.005–1.030)
pH: 7 (ref 5.0–8.0)

## 2019-05-19 LAB — TROPONIN I (HIGH SENSITIVITY)
Troponin I (High Sensitivity): <2 ng/L (ref <18)
Troponin I (High Sensitivity): 3 ng/L (ref <18)

## 2019-05-19 LAB — LIPASE, BLOOD: Lipase: 28 U/L (ref 11–51)

## 2019-05-19 LAB — SURGICAL PCR SCREEN
MRSA, PCR: NEGATIVE
Staphylococcus aureus: POSITIVE — AB

## 2019-05-19 LAB — SARS CORONAVIRUS 2 BY RT PCR (HOSPITAL ORDER, PERFORMED IN ~~LOC~~ HOSPITAL LAB): SARS Coronavirus 2: NEGATIVE

## 2019-05-19 MED ORDER — IOHEXOL 300 MG/ML  SOLN
100.0000 mL | Freq: Once | INTRAMUSCULAR | Status: AC | PRN
  Administered 2019-05-19: 07:00:00 100 mL via INTRAVENOUS

## 2019-05-19 MED ORDER — MUPIROCIN 2 % EX OINT
1.0000 "application " | TOPICAL_OINTMENT | Freq: Two times a day (BID) | CUTANEOUS | Status: DC
  Administered 2019-05-19 – 2019-05-21 (×4): 1 via NASAL
  Filled 2019-05-19 (×2): qty 22

## 2019-05-19 MED ORDER — CHLORHEXIDINE GLUCONATE CLOTH 2 % EX PADS
6.0000 | MEDICATED_PAD | Freq: Once | CUTANEOUS | Status: AC
  Administered 2019-05-19: 6 via TOPICAL

## 2019-05-19 MED ORDER — CIPROFLOXACIN IN D5W 400 MG/200ML IV SOLN
400.0000 mg | Freq: Two times a day (BID) | INTRAVENOUS | Status: DC
  Administered 2019-05-19: 400 mg via INTRAVENOUS
  Filled 2019-05-19: qty 200

## 2019-05-19 MED ORDER — CIPROFLOXACIN IN D5W 400 MG/200ML IV SOLN
400.0000 mg | INTRAVENOUS | Status: AC
  Administered 2019-05-20: 400 mg via INTRAVENOUS
  Filled 2019-05-19: qty 200

## 2019-05-19 MED ORDER — VILAZODONE HCL 20 MG PO TABS
1.0000 | ORAL_TABLET | Freq: Every day | ORAL | Status: DC
  Administered 2019-05-19 – 2019-05-21 (×3): 20 mg via ORAL
  Filled 2019-05-19 (×4): qty 1

## 2019-05-19 MED ORDER — ACETAMINOPHEN 650 MG RE SUPP: 650.0000 mg | Freq: Four times a day (QID) | RECTAL | Status: DC | PRN

## 2019-05-19 MED ORDER — CARBAMAZEPINE ER 400 MG PO TB12
400.0000 mg | ORAL_TABLET | Freq: Three times a day (TID) | ORAL | Status: DC
  Administered 2019-05-19 – 2019-05-21 (×6): 400 mg via ORAL
  Filled 2019-05-19: qty 1
  Filled 2019-05-19: qty 4
  Filled 2019-05-19 (×9): qty 1

## 2019-05-19 MED ORDER — SIMETHICONE 80 MG PO CHEW
40.0000 mg | CHEWABLE_TABLET | Freq: Four times a day (QID) | ORAL | Status: DC | PRN
  Administered 2019-05-19: 18:00:00 40 mg via ORAL
  Filled 2019-05-19: qty 1

## 2019-05-19 MED ORDER — ONDANSETRON HCL 4 MG/2ML IJ SOLN: 4.0000 mg | Freq: Once | INTRAMUSCULAR | Status: DC

## 2019-05-19 MED ORDER — ONDANSETRON 4 MG PO TBDP: 4.0000 mg | ORAL_TABLET | Freq: Three times a day (TID) | ORAL | 0 refills | Status: DC | PRN

## 2019-05-19 MED ORDER — ONDANSETRON 4 MG PO TBDP: 4.0000 mg | ORAL_TABLET | Freq: Four times a day (QID) | ORAL | Status: DC | PRN

## 2019-05-19 MED ORDER — CHLORHEXIDINE GLUCONATE CLOTH 2 % EX PADS
6.0000 | MEDICATED_PAD | Freq: Once | CUTANEOUS | Status: AC
  Administered 2019-05-20: 6 via TOPICAL

## 2019-05-19 MED ORDER — DIPHENHYDRAMINE HCL 12.5 MG/5ML PO ELIX: 12.5000 mg | ORAL_SOLUTION | Freq: Four times a day (QID) | ORAL | Status: DC | PRN

## 2019-05-19 MED ORDER — IBUPROFEN 600 MG PO TABS: 600.0000 mg | ORAL_TABLET | Freq: Four times a day (QID) | ORAL | Status: DC | PRN

## 2019-05-19 MED ORDER — METOPROLOL TARTRATE 5 MG/5ML IV SOLN: 5.0000 mg | Freq: Four times a day (QID) | INTRAVENOUS | Status: DC | PRN

## 2019-05-19 MED ORDER — DOCUSATE SODIUM 100 MG PO CAPS
100.0000 mg | ORAL_CAPSULE | Freq: Two times a day (BID) | ORAL | Status: DC
  Administered 2019-05-19 – 2019-05-21 (×5): 100 mg via ORAL
  Filled 2019-05-19 (×5): qty 1

## 2019-05-19 MED ORDER — ENOXAPARIN SODIUM 40 MG/0.4ML ~~LOC~~ SOLN
40.0000 mg | SUBCUTANEOUS | Status: DC
  Administered 2019-05-19: 40 mg via SUBCUTANEOUS
  Filled 2019-05-19: qty 0.4

## 2019-05-19 MED ORDER — DIPHENHYDRAMINE HCL 50 MG/ML IJ SOLN
12.5000 mg | Freq: Four times a day (QID) | INTRAMUSCULAR | Status: DC | PRN
  Administered 2019-05-19: 12.5 mg via INTRAVENOUS
  Filled 2019-05-19: qty 1

## 2019-05-19 MED ORDER — ONDANSETRON HCL 4 MG/2ML IJ SOLN: 4.0000 mg | Freq: Four times a day (QID) | INTRAMUSCULAR | Status: DC | PRN

## 2019-05-19 MED ORDER — SODIUM CHLORIDE 0.9 % IV SOLN
1.0000 g | Freq: Once | INTRAVENOUS | Status: AC
  Administered 2019-05-19: 1000 mg via INTRAVENOUS
  Filled 2019-05-19: qty 1

## 2019-05-19 MED ORDER — MELATONIN 3 MG PO TABS
1.0000 | ORAL_TABLET | Freq: Every day | ORAL | Status: DC
  Administered 2019-05-19 – 2019-05-20 (×2): 3 mg via ORAL
  Filled 2019-05-19 (×2): qty 1

## 2019-05-19 MED ORDER — LACTATED RINGERS IV SOLN
INTRAVENOUS | Status: DC
  Administered 2019-05-19 (×2): via INTRAVENOUS

## 2019-05-19 MED ORDER — MORPHINE SULFATE (PF) 2 MG/ML IV SOLN
2.0000 mg | INTRAVENOUS | Status: DC | PRN
  Administered 2019-05-20 (×2): 2 mg via INTRAVENOUS
  Filled 2019-05-19 (×2): qty 1

## 2019-05-19 MED ORDER — ACETAMINOPHEN 325 MG PO TABS: 650.0000 mg | ORAL_TABLET | Freq: Four times a day (QID) | ORAL | Status: DC | PRN

## 2019-05-19 MED ORDER — LEVETIRACETAM 500 MG PO TABS
1000.0000 mg | ORAL_TABLET | Freq: Two times a day (BID) | ORAL | Status: DC
  Administered 2019-05-19 – 2019-05-21 (×5): 1000 mg via ORAL
  Filled 2019-05-19 (×3): qty 2
  Filled 2019-05-19: qty 4
  Filled 2019-05-19: qty 2

## 2019-05-19 MED ORDER — SODIUM CHLORIDE 0.9 % IV SOLN
1.0000 g | INTRAVENOUS | Status: DC
  Filled 2019-05-19: qty 1

## 2019-05-19 MED ORDER — OXYCODONE HCL 5 MG PO TABS
5.0000 mg | ORAL_TABLET | ORAL | Status: DC | PRN
  Administered 2019-05-19 – 2019-05-21 (×2): 5 mg via ORAL
  Filled 2019-05-19 (×2): qty 1

## 2019-05-19 MED ORDER — PANTOPRAZOLE SODIUM 40 MG IV SOLR
40.0000 mg | Freq: Every day | INTRAVENOUS | Status: DC
  Administered 2019-05-19 – 2019-05-20 (×2): 40 mg via INTRAVENOUS
  Filled 2019-05-19 (×2): qty 40

## 2019-05-19 MED ORDER — OMEPRAZOLE 20 MG PO CPDR: 20.0000 mg | DELAYED_RELEASE_CAPSULE | Freq: Every day | ORAL | 0 refills | Status: DC

## 2019-05-19 MED ORDER — VENLAFAXINE HCL ER 37.5 MG PO CP24
37.5000 mg | ORAL_CAPSULE | ORAL | Status: DC
  Administered 2019-05-19 – 2019-05-21 (×2): 37.5 mg via ORAL
  Filled 2019-05-19 (×2): qty 1

## 2019-05-19 NOTE — ED Triage Notes (Signed)
Pt reports RUQ pain that started after eating, the same thing happened about 2 weeks ago. Pt reports pain was 10 of 10 until EMS administered 4mg  morphine, pain is now 1 of 10

## 2019-05-19 NOTE — ED Provider Notes (Signed)
[facility] EMERGENCY DEPARTMENT Provider Note   CSN: [id number] Arrival date & time: 05/19/19  [id number]     History   Chief Complaint Chief Complaint  Patient presents with  . Abdominal Pain    RUQ    HPI Patrick Ray is a 38 y.o. male.     Patient presents by EMS with severe right upper quadrant pain that onset earlier this evening while he was watching television.  Reports he has had some burning in his chest for 30 minutes or so that he felt was due to heartburn.  He then had severe right upper quadrant pain that persisted for several hours with multiple episodes of nausea and vomiting and some loose stools.  The pain is now mostly resolved after receiving morphine by EMS.  Denies fever.  States he had a similar episode of pain 2 weeks ago after eating chili which resolved on its own.  States history of acid reflux but is no longer on any medication for it.  Still has a gallbladder and appendix. Denies any previous abdominal surgeries.  No chest pain or shortness of breath currently.  No pain with urination or blood in the urine.  No testicular pain.  The history is provided by the patient and the EMS personnel.  Abdominal Pain Associated symptoms: diarrhea, nausea and vomiting   Associated symptoms: no chest pain, no cough, no dysuria, no hematuria and no shortness of breath     Past Medical History:  Diagnosis Date  . Anxiety   . Cerebral palsy ([place])   . Cerebral palsy ([place])   . Forceps delivery   . Seizure ([place])   . Seizures [facility])     Patient Active Problem List   Diagnosis Date Noted  . Anxiety 07/27/2016  . 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 Hyperlipidemia 05/09/2016  . 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 Muscle spasm 08/09/2013  . Seizures ([place])   . Cerebral palsy [facility])     Past Surgical History:  Procedure Laterality Date  . arm surgery    . HAND SURGERY          Home Medications    Prior to Admission medications    Medication Sig Start Date End Date Taking? Authorizing Provider  benzonatate (TESSALON PERLES) 100 MG capsule Take 1 capsule (100 mg total) by mouth 3 (three) times daily as needed. 08/30/18   Janora Norlander, DO  carbamazepine (TEGRETOL XR) 400 MG 12 hr tablet Take 400 mg by mouth 3 (three) times daily after meals.      [provider]  cholecalciferol (VITAMIN D) 1000 UNITS tablet Take 1,000 Units by mouth daily.      [provider]  levETIRAcetam (KEPPRA) 500 MG tablet  07/31/13   [provider]  Melatonin 10 MG CAPS Take 1 capsule by mouth at bedtime.    [provider]  multivitamin [facility]) per tablet Take 1 tablet by mouth daily.      [provider]  Pitavastatin Calcium (LIVALO) 2 MG TABS Take 1 tablet (2 mg total) by mouth daily. 09/19/17   Dettinger, Fransisca Kaufmann, MD  venlafaxine XR (EFFEXOR XR) 37.5 MG 24 hr capsule Take 1 capsule (37.5 mg total) by mouth daily with breakfast. 06/25/18   Dettinger, Fransisca Kaufmann, MD  venlafaxine XR (EFFEXOR-XR) 37.5 MG 24 hr capsule Take 1 capsule (37.5 mg total) by mouth daily with breakfast. (Needs to be seen) 06/25/18   Dettinger, Fransisca Kaufmann, MD  Family History No family history on file.  Social History Social History   Tobacco Use  . Smoking status: Former Smoker    Quit date: 12/17/2015    Years since quitting: 3.4  . Smokeless tobacco: Never Used  Substance Use Topics  . Alcohol use: No  . Drug use: Never     Allergies   Penicillins   Review of Systems Review of Systems  Constitutional: Negative for activity change and appetite change.  HENT: Negative for congestion and rhinorrhea.   Respiratory: Negative for cough, chest tightness and shortness of breath.   Cardiovascular: Negative for chest pain.  Gastrointestinal: Positive for abdominal pain, diarrhea, nausea and vomiting.  Genitourinary: Negative for dysuria and hematuria.  Musculoskeletal: Negative for arthralgias, back pain and  myalgias.  Skin: Negative for rash.  Neurological: Negative for dizziness, weakness and headaches.   all other systems are negative except as noted in the HPI and PMH.     Physical Exam Updated Vital Signs BP 126/89   Pulse 71   Temp 98 F (36.7 C) (Oral)   Resp 15   Ht 5\' 10"  (1.778 m)   Wt 84.4 kg   SpO2 96%   BMI 26.69 kg/m   Physical Exam Vitals signs and nursing note reviewed.  Constitutional:      General: He is not in acute distress.    Appearance: He is well-developed. He is obese.  HENT:     Head: Normocephalic and atraumatic.     Mouth/Throat:     Pharynx: No oropharyngeal exudate.  Eyes:     Conjunctiva/sclera: Conjunctivae normal.     Pupils: Pupils are equal, round, and reactive to light.  Neck:     Musculoskeletal: Normal range of motion and neck supple.     Comments: No meningismus. Cardiovascular:     Rate and Rhythm: Normal rate and regular rhythm.     Heart sounds: Normal heart sounds. No murmur.  Pulmonary:     Effort: Pulmonary effort is normal. No respiratory distress.     Breath sounds: Normal breath sounds.  Abdominal:     Palpations: Abdomen is soft.     Tenderness: There is abdominal tenderness. There is no guarding or rebound.     Comments: Epigastric and right upper quadrant tenderness with no guarding or rebound  Genitourinary:    Comments: No testicular tenderness Musculoskeletal: Normal range of motion.        General: No tenderness.     Comments: No CVA tenderness  Skin:    General: Skin is warm.  Neurological:     Mental Status: He is alert and oriented to person, place, and time.     Cranial Nerves: No cranial nerve deficit.     Motor: No abnormal muscle tone.     Coordination: Coordination normal.     Comments: No ataxia on finger to nose bilaterally. No pronator drift. 5/5 strength throughout. CN 2-12 intact.Equal grip strength. Sensation intact.   Psychiatric:        Behavior: Behavior normal.      ED Treatments /  Results  Labs (all labs ordered are listed, but only abnormal results are displayed) Labs Reviewed  COMPREHENSIVE METABOLIC PANEL - Abnormal; Notable for the following components:      Result Value   Glucose, Bld 107 (*)    Calcium 8.4 (*)    All other components within normal limits  URINALYSIS, ROUTINE W REFLEX MICROSCOPIC - Abnormal; Notable for the following components:   Color,  Urine STRAW (*)    Specific Gravity, Urine >1.046 (*)    All other components within normal limits  CBC WITH DIFFERENTIAL/PLATELET  LIPASE, BLOOD  TROPONIN I (HIGH SENSITIVITY)  TROPONIN I (HIGH SENSITIVITY)    EKG None  ED ECG REPORT   Date:  05/19/2019  Rate: 74  Rhythm: normal sinus rhythm  QRS Axis: normal  Intervals: normal  ST/T Wave abnormalities: normal  Conduction Disutrbances:none  Narrative Interpretation:   Old EKG Reviewed: unchanged  I have personally reviewed the EKG tracing and agree with the computerized printout as noted.   Radiology No results found.  Procedures Procedures (including critical care time)  Medications Ordered in ED Medications  ondansetron (ZOFRAN) injection 4 mg (has no administration in time range)     Initial Impression / Assessment and Plan / ED Course  I have reviewed the triage vital signs and the nursing notes.  Pertinent labs & imaging results that were available during my care of the patient were reviewed by me and considered in my medical decision making (see chart for details).       Right upper quadrant pain with nausea and vomiting.  Will obtain labs and evaluate for gallbladder pathology  Ultrasound not available at this time of day.  LFTs and lipase are normal.  No leukocytosis.  No recurrence of abdominal pain since patient has been in the ED.  CT scan will be obtained to evaluate his gallbladder.  This is pending at time of shift change.  Anticipate discharge with PPI, outpatient [place] RUQ and referral to general surgery if CT  scan is reassuring. Dr. Sabra Heck to assume care at shift change  Final Clinical Impressions(s) / ED Diagnoses   Final diagnoses:  RUQ pain    ED Discharge Orders         Ordered    omeprazole (PRILOSEC) 20 MG capsule  Daily     05/19/19 0708    ondansetron (ZOFRAN ODT) 4 MG disintegrating tablet  Every 8 hours PRN     05/19/19 0708    US Abdomen Limited RUQ     05/19/19 0708           Ezequiel Essex, MD 05/19/19 [id number]

## 2019-05-19 NOTE — H&P (Signed)
[facility] History and Physical  Reason for Referral: Acute cholecystitis  Referring Physician:  Kem Parkinson, PA (ED)   Chief Complaint    Abdominal Pain      Patrick Ray is a 38 y.o. male.  HPI: Mr. Mcgavock is a very pleasant 38 yo male with acute onset of RUQ pain that started after eating supper and has continued with associated nausea/vomiting, and pain in the RUQ that is not improving. The pain is burning in nature and he has had some loose stools.  He reported a prior history of a similar attack but that was not as bad about 2 weeks prior experiencing the same pain, nausea, vomiting, and had thought that this was secondary to having eaten something that was spoiled or bad.  The morphine from EMS helped his pain.  He says that aside from these attacks he has not had issues with any stomach issues.  He has a history of Cerebral palsy, surgeries on his left hand and arm, and has no history of any issues with anesthesia from his prior surgeries.  He takes medications for a history of seizures.    Past Medical History:  Diagnosis Date  . Anxiety   . Cerebral palsy ([place])   . Cerebral palsy ([place])   . Forceps delivery   . Seizure ([place])   . Seizures ([place])     Past Surgical History:  Procedure Laterality Date  . arm surgery    . HAND SURGERY      Family History  Problem Relation Age of Onset  . Hypertension Father   . Diabetes Father     Social History   Tobacco Use  . Smoking status: Former Smoker    Quit date: 12/17/2015    Years since quitting: 3.4  . Smokeless tobacco: Never Used  Substance Use Topics  . Alcohol use: No  . Drug use: Never    Medications:  I have reviewed the patient's current medications. Prior to Admission:  Current Facility-Administered Medications  Medication Dose Route Frequency Provider Last Rate Last Dose  . acetaminophen (TYLENOL) tablet 650 mg  650 mg Oral Q6H PRN Virl Cagey, MD       Or  . acetaminophen  (TYLENOL) suppository 650 mg  650 mg Rectal Q6H PRN Virl Cagey, MD      . ciprofloxacin (CIPRO) IVPB 400 mg  400 mg Intravenous Q12H Virl Cagey, MD      . diphenhydrAMINE (BENADRYL) 12.5 MG/5ML elixir 12.5 mg  12.5 mg Oral Q6H PRN Virl Cagey, MD       Or  . diphenhydrAMINE (BENADRYL) injection 12.5 mg  12.5 mg Intravenous Q6H PRN Virl Cagey, MD      . docusate sodium (COLACE) capsule 100 mg  100 mg Oral BID Virl Cagey, MD      . enoxaparin (LOVENOX) injection 40 mg  40 mg Subcutaneous Q24H Virl Cagey, MD      . ibuprofen (ADVIL) tablet 600 mg  600 mg Oral Q6H PRN Virl Cagey, MD      . lactated ringers infusion   Intravenous Continuous Virl Cagey, MD      . metoprolol tartrate (LOPRESSOR) injection 5 mg  5 mg Intravenous Q6H PRN Virl Cagey, MD      . morphine 2 MG/ML injection 2 mg  2 mg Intravenous Q3H PRN Virl Cagey, MD      . ondansetron (ZOFRAN-ODT) disintegrating tablet 4 mg  4 mg Oral Q6H PRN Virl Cagey, MD       Or  . ondansetron [facility]) injection 4 mg  4 mg Intravenous Q6H PRN Virl Cagey, MD      . oxyCODONE (Oxy IR/ROXICODONE) immediate release tablet 5-10 mg  5-10 mg Oral Q4H PRN Virl Cagey, MD      . pantoprazole (PROTONIX) injection 40 mg  40 mg Intravenous QHS Virl Cagey, MD      . simethicone [facility]) chewable tablet 40 mg  40 mg Oral Q6H PRN Virl Cagey, MD       Current Outpatient Medications  Medication Sig Dispense Refill Last Dose  . carbamazepine (TEGRETOL XR) 400 MG 12 hr tablet Take 400 mg by mouth 3 (three) times daily after meals.     05/18/2019 at 1400  . ibuprofen (ADVIL) 200 MG tablet Take 600 mg by mouth every 6 (six) hours as needed for headache or moderate pain.   05/18/2019 at 2000  . levETIRAcetam (KEPPRA) 1000 MG tablet Take 1,000 mg by mouth 2 (two) times daily.    05/18/2019 at 1000  . Melatonin 10 MG CAPS Take 1 capsule by mouth at bedtime.    05/17/2019  . multivitamin (THERAGRAN) per tablet Take 1 tablet by mouth daily.     05/18/2019 at Unknown time  . venlafaxine XR (EFFEXOR XR) 37.5 MG 24 hr capsule Take 1 capsule (37.5 mg total) by mouth daily with breakfast. (Patient taking differently: Take 37.5 mg by mouth every other day. ) 14 capsule 0 05/17/2019  . VIIBRYD 20 MG TABS Take 1 tablet by mouth daily.   05/18/2019 at 1000  . omeprazole (PRILOSEC) 20 MG capsule Take 1 capsule (20 mg total) by mouth daily. 30 capsule 0   . ondansetron (ZOFRAN ODT) 4 MG disintegrating tablet Take 1 tablet (4 mg total) by mouth every 8 (eight) hours as needed for nausea or vomiting. 20 tablet 0    Scheduled: . docusate sodium  100 mg Oral BID  . enoxaparin (LOVENOX) injection  40 mg Subcutaneous Q24H  . pantoprazole (PROTONIX) IV  40 mg Intravenous QHS   Continuous: . ciprofloxacin    . lactated ringers     [id number] **OR** acetaminophen, diphenhydrAMINE **OR** diphenhydrAMINE, ibuprofen, metoprolol tartrate, morphine injection, ondansetron **OR** ondansetron (ZOFRAN) IV, oxyCODONE, simethicone  Allergies  Allergen Reactions  . Amoxicillin Rash  . Penicillins Rash    .Did it involve swelling of the face/tongue/throat, SOB, or low BP? unknown Did it involve sudden or severe rash/hives, skin peeling, or any reaction on the inside of your mouth or nose? yesDid you need to seek medical attention at a hospital or doctor's office? no When did it last happen?childhood If all above answers are "NO", may proceed with cephalosporin use.     ROS:  A comprehensive review of systems was negative except for: Gastrointestinal: positive for abdominal pain, nausea, reflux symptoms and vomiting  Blood pressure 119/83, pulse 65, temperature 98 F (36.7 C), temperature source Oral, resp. rate 15, height 5\' 10"  (1.778 m), weight 84.4 kg, SpO2 97 %. Physical Exam Vitals signs reviewed.  Constitutional:      Appearance: He is  well-developed.  HENT:     Head: Normocephalic and atraumatic.  Eyes:     Extraocular Movements: Extraocular movements intact.  Cardiovascular:     Rate and Rhythm: Normal rate and regular rhythm.  Pulmonary:     Effort: Pulmonary effort is normal.     Breath sounds:  Normal breath sounds.  Abdominal:     General: There is distension.     Tenderness: There is abdominal tenderness in the right upper quadrant and epigastric area. There is no guarding or rebound.     Hernia: No hernia is present.  Musculoskeletal:     Comments: Bilateral lower extremities without edema  Skin:    General: Skin is warm and dry.  Neurological:     General: No focal deficit present.     Mental Status: He is alert and oriented to person, place, and time.  Psychiatric:        Mood and Affect: Mood normal.        Behavior: Behavior normal.     Results: Results for orders placed or performed during the hospital encounter of 05/19/19 (from the past 48 hour(s))  CBC with Differential/Platelet     Status: None   Collection Time: 05/19/19  4:26 AM  Result Value Ref Range   WBC 9.2 4.0 - 10.5 K/uL   RBC 4.97 4.22 - 5.81 MIL/uL   Hemoglobin 15.7 13.0 - 17.0 g/dL   HCT 45.6 39.0 - 52.0 %   MCV 91.8 80.0 - 100.0 fL   MCH 31.6 26.0 - 34.0 pg   MCHC 34.4 30.0 - 36.0 g/dL   RDW 11.7 11.5 - 15.5 %   Platelets 239 150 - 400 K/uL   nRBC 0.0 0.0 - 0.2 %   Neutrophils Relative % 74 %   Neutro Abs 6.8 1.7 - 7.7 K/uL   Lymphocytes Relative 18 %   Lymphs Abs 1.6 0.7 - 4.0 K/uL   Monocytes Relative 6 %   Monocytes Absolute 0.5 0.1 - 1.0 K/uL   Eosinophils Relative 1 %   Eosinophils Absolute 0.1 0.0 - 0.5 K/uL   Basophils Relative 1 %   Basophils Absolute 0.1 0.0 - 0.1 K/uL   Immature Granulocytes 0 %   Abs Immature Granulocytes 0.01 0.00 - 0.07 K/uL    Comment: Performed at [facility], [address] [place], [place] [address]  Comprehensive metabolic panel     Status: Abnormal   Collection Time: 05/19/19   4:26 AM  Result Value Ref Range   Sodium 138 135 - 145 mmol/L   Potassium 3.8 3.5 - 5.1 mmol/L   Chloride 108 98 - 111 mmol/L   CO2 22 22 - 32 mmol/L   Glucose, Bld 107 (H) 70 - 99 mg/dL   BUN 15 6 - 20 mg/dL   Creatinine, Ser 0.73 0.61 - 1.24 mg/dL   Calcium 8.4 (L) 8.9 - 10.3 mg/dL   Total Protein 7.1 6.5 - 8.1 g/dL   Albumin 4.1 3.5 - 5.0 g/dL   AST 21 15 - 41 U/L   ALT 25 0 - 44 U/L   Alkaline Phosphatase 91 38 - 126 U/L   Total Bilirubin 0.9 0.3 - 1.2 mg/dL   GFR calc non Af Amer >60 >60 mL/min   GFR calc Af Amer >60 >60 mL/min   Anion gap 8 5 - 15    Comment: Performed at [facility], [address] [place], [place] [address]  Lipase, blood     Status: None   Collection Time: 05/19/19  4:26 AM  Result Value Ref Range   Lipase 28 11 - 51 U/L    Comment: Performed at [facility], [address], [place], [place] [address]  Troponin I (High Sensitivity)     Status: None   Collection Time: 05/19/19  4:26 AM  Result Value Ref Range   Troponin I (High Sensitivity) <2.0 <18 ng/L    Comment: Performed at [facility], [address] [place], [place] [address]  Troponin I (High Sensitivity)     Status: None   Collection Time: 05/19/19  5:38 AM  Result Value Ref Range   Troponin I (High Sensitivity) 3 <18 ng/L    Comment: (NOTE) Elevated high sensitivity troponin I (hsTnI) values and significant  changes across serial measurements may suggest ACS but many other  chronic and acute conditions are known to elevate hsTnI results.  Refer to the "Links" section for chest pain algorithms and additional  guidance. Performed at [facility], [address] [place], [place] [address]   Urinalysis, Routine w reflex microscopic     Status: Abnormal   Collection Time: 05/19/19  7:02 AM  Result Value Ref Range   Color, Urine STRAW (A) YELLOW   APPearance CLEAR CLEAR   Specific Gravity, Urine >1.046 (H) 1.005 - 1.030   pH 7.0 5.0 - 8.0   Glucose, UA NEGATIVE NEGATIVE mg/dL   Hgb urine  dipstick NEGATIVE NEGATIVE   Bilirubin Urine NEGATIVE NEGATIVE   Ketones, ur NEGATIVE NEGATIVE mg/dL   Protein, ur NEGATIVE NEGATIVE mg/dL   Nitrite NEGATIVE NEGATIVE   Leukocytes,Ua NEGATIVE NEGATIVE    Comment: Performed at [facility], [address] [place], [place] [address]  SARS Coronavirus 2 by RT PCR (hospital order, performed in [place] hospital lab) Nasopharyngeal Nasopharyngeal Swab     Status: None   Collection Time: 05/19/19  9:56 AM   Specimen: Nasopharyngeal Swab  Result Value Ref Range   SARS Coronavirus 2 NEGATIVE NEGATIVE    Comment: (NOTE) If result is NEGATIVE SARS-CoV-2 target nucleic acids are NOT DETECTED. The SARS-CoV-2 RNA is generally detectable in upper and lower  respiratory specimens during the acute phase of infection. The lowest  concentration of SARS-CoV-2 viral copies this assay can detect is 250  copies / mL. A negative result does not preclude SARS-CoV-2 infection  and should not be used as the sole basis for treatment or other  patient management decisions.  A negative result may occur with  improper specimen collection / handling, submission of specimen other  than nasopharyngeal swab, presence of viral mutation(s) within the  areas targeted by this assay, and inadequate number of viral copies  (<250 copies / mL). A negative result must be combined with clinical  observations, patient history, and epidemiological information. If result is POSITIVE SARS-CoV-2 target nucleic acids are DETECTED. The SARS-CoV-2 RNA is generally detectable in upper and lower  respiratory specimens dur ing the acute phase of infection.  Positive  results are indicative of active infection with SARS-CoV-2.  Clinical  correlation with patient history and other diagnostic information is  necessary to determine patient infection status.  Positive results do  not rule out bacterial infection or co-infection with other viruses. If result is PRESUMPTIVE  POSTIVE SARS-CoV-2 nucleic acids MAY BE PRESENT.   A presumptive positive result was obtained on the submitted specimen  and confirmed on repeat testing.  While 2019 novel coronavirus  (SARS-CoV-2) nucleic acids may be present in the submitted sample  additional confirmatory testing may be necessary for epidemiological  and / or clinical management purposes  to differentiate between  SARS-CoV-2 and other Sarbecovirus currently known to infect humans.  If clinically indicated additional testing with an alternate test  methodology [phone]) is advised. The SARS-CoV-2 RNA is generally  detectable in upper and  lower respiratory sp ecimens during the acute  phase of infection. The expected result is Negative. Fact Sheet for Patients:  StrictlyIdeas.no Fact Sheet for Healthcare Providers: BankingDealers.co.za This test is not yet approved or cleared by the [place] FDA and has been authorized for detection and/or diagnosis of SARS-CoV-2 by FDA under an Emergency Use Authorization (EUA).  This EUA will remain in effect (meaning this test can be used) for the duration of the COVID-19 declaration under Section 564(b)(1) of the Act, 21 U.S.C. section 360bbb-3(b)(1), unless the authorization is terminated or revoked sooner. Performed at [facility], [address] [place], [place] [address]    Personally reviewed CT- dilated gallbladder with edema surrounding the gallbladder, no biliary dilation  Ct Abdomen Pelvis W Contrast  Result Date: 05/19/2019 CLINICAL DATA:  Upper abdominal pain EXAM: CT ABDOMEN AND PELVIS WITH CONTRAST TECHNIQUE: Multidetector CT imaging of the abdomen and pelvis was performed using the standard protocol following bolus administration of intravenous contrast. CONTRAST:  151mL OMNIPAQUE IOHEXOL 300 MG/ML  SOLN COMPARISON:  None. FINDINGS: Lower chest: There is bibasilar atelectasis. There is no lung base edema or  consolidation. Hepatobiliary: There is a decreased attenuation mass in the posterior segment right lobe of the liver measuring 1.1 x 1.0 cm. No other focal liver lesions are evident. There is evidence of thickening of a portion of the gallbladder wall with apparent pericholecystic fluid. There is a suspected gallstone in the neck of the gallbladder. There is no appreciable biliary duct dilatation. Pancreas: No pancreatic mass or inflammatory focus. Spleen: No splenic lesions are evident. Adrenals/Urinary Tract: Right adrenal appears normal. There is a 1 x 1 cm adenoma in the lateral aspect of the left adrenal. Kidneys bilaterally show no evident mass or hydronephrosis on either side. There is no appreciable renal or ureteral calculus on either side. Urinary bladder is midline with wall thickness within normal limits. Stomach/Bowel: There is no appreciable bowel wall or mesenteric thickening. There is no evident bowel obstruction. The terminal ileum appears normal. There is no demonstrable free air or portal venous air. Vascular/Lymphatic: There is no abdominal aortic aneurysm. There is aortic plaque within the aorta. Major mesenteric arterial vessels appear patent. Major venous structures appear patent. There is no evident adenopathy in the abdomen or pelvis. Reproductive: Prostate and seminal vesicles are normal in size and contour. There is no evident pelvic mass. Other: Appendix appears unremarkable. There is no abscess or ascites in the abdomen or pelvis. Musculoskeletal: There are no blastic or lytic bone lesions. There is no intramuscular or abdominal wall lesion. IMPRESSION: 1. Appearance of gallbladder concerning for a degree of acute cholecystitis. Correlation with gallbladder ultrasound advised in this regard. 2. No evident bowel obstruction. No abscess in the abdomen or pelvis. Appendix appears normal. 3. No evident renal or ureteral calculus. No hydronephrosis. Urinary bladder wall thickness is within  normal limits. 4.  1 x 1 cm benign left adrenal adenoma. 5. Suspect small hemangioma in right lobe of liver. Nonemergent pre and serial post-contrast MR or CT of the liver could be confirmatory in this regard. Electronically Signed   By: Lowella Grip III M.D.   On: 05/19/2019 08:21    Assessment & Plan:  HUSSAN BOYDEN is a 38 y.o. male with history of RUQ pain and significant edema around the gallbladder consistent with cholecystitis. He has a normal leukocytosis but is very tender. His LFTs are normal.     -Observation overnight, Full liquid diet, NPO midnight  -PLAN: I  counseled the patient about the indication, risks and benefits of laparoscopic cholecystectomy.  He understands there is a very small chance for bleeding, infection, injury to normal structures (including common bile duct), conversion to open surgery, persistent symptoms, evolution of postcholecystectomy diarrhea, need for secondary interventions, anesthesia reaction, cardiopulmonary issues and other risks not specifically detailed here. I described the expected recovery, the plan for follow-up and the restrictions during the recovery phase.  All questions were answered.  -Lap cholecystectomy tomorrow, Labs in AM -LR @ 50 -Ciprofloxacin for cholecystitis (PCN allergy)  -COVID negative  -ED has ordered US but not done yet, given clinical signs and symptoms, acute cholecystitis is highly likely versus some chronic degree of cholecystitis given his prior episode   All questions were answered to the satisfaction of the patient and family.   Virl Cagey 05/19/2019, 11:39 AM

## 2019-05-19 NOTE — ED Provider Notes (Signed)
   Pt with right upper abdominal pain waxing and waning for 2 weeks.  Pain worse after food intake.  Intermittent nausea and vomiting.  Seen this morning by over night provider and CT abd/pelvis and labs ordered, results pending at time of shift change.   Patient last meal was 530 last evening.  He remains n.p.o.  CT abdomen and pelvis results concerning for acute cholecystitis.  Patient has tenderness to right upper quadrant with some guarding noted.  Abdomen remains soft, no leukocytosis, afebrile.  No vomiting in the dept.  COVID test pending  1000 consulted surgeon on call, who agrees to see pt in ED     Kem Parkinson, PA-C 05/19/19 1045    Noemi Chapel, MD 05/22/19 1005

## 2019-05-19 NOTE — ED Notes (Signed)
Patient transported to CT 

## 2019-05-20 ENCOUNTER — Observation Stay (HOSPITAL_COMMUNITY): Payer: BC Managed Care – PPO | Admitting: Anesthesiology

## 2019-05-20 ENCOUNTER — Encounter (HOSPITAL_COMMUNITY): Payer: Self-pay | Admitting: *Deleted

## 2019-05-20 ENCOUNTER — Encounter (HOSPITAL_COMMUNITY)
Admission: EM | Disposition: A | Discharge status: Home / Self Care | Payer: Self-pay | Source: Home / Self Care | Attending: Emergency Medicine

## 2019-05-20 ENCOUNTER — Telehealth: Payer: Self-pay

## 2019-05-20 DIAGNOSIS — K8012 Calculus of gallbladder with acute and chronic cholecystitis without obstruction: Secondary | ICD-10-CM | POA: Diagnosis not present

## 2019-05-20 DIAGNOSIS — K810 Acute cholecystitis: Secondary | ICD-10-CM | POA: Diagnosis not present

## 2019-05-20 HISTORY — PX: CHOLECYSTECTOMY: SHX55

## 2019-05-20 LAB — COMPREHENSIVE METABOLIC PANEL
ALT: 27 U/L (ref 0–44)
AST: 21 U/L (ref 15–41)
Albumin: 3.7 g/dL (ref 3.5–5.0)
Alkaline Phosphatase: 94 U/L (ref 38–126)
Anion gap: 9 (ref 5–15)
BUN: 7 mg/dL (ref 6–20)
CO2: 23 mmol/L (ref 22–32)
Calcium: 8.5 mg/dL — ABNORMAL LOW (ref 8.9–10.3)
Chloride: 109 mmol/L (ref 98–111)
Creatinine, Ser: 0.8 mg/dL (ref 0.61–1.24)
GFR calc Af Amer: >60 mL/min (ref >60)
GFR calc non Af Amer: >60 mL/min (ref >60)
Glucose, Bld: 97 mg/dL (ref 70–99)
Potassium: 3.9 mmol/L (ref 3.5–5.1)
Sodium: 141 mmol/L (ref 135–145)
Total Bilirubin: 0.7 mg/dL (ref 0.3–1.2)
Total Protein: 6.7 g/dL (ref 6.5–8.1)

## 2019-05-20 LAB — CBC WITH DIFFERENTIAL/PLATELET
Abs Immature Granulocytes: 0.01 10*3/uL (ref 0.00–0.07)
Basophils Absolute: 0.1 10*3/uL (ref 0.0–0.1)
Basophils Relative: 1 %
Eosinophils Absolute: 0.2 10*3/uL (ref 0.0–0.5)
Eosinophils Relative: 3 %
HCT: 47.6 % (ref 39.0–52.0)
Hemoglobin: 15.6 g/dL (ref 13.0–17.0)
Immature Granulocytes: 0 %
Lymphocytes Relative: 40 %
Lymphs Abs: 2.2 10*3/uL (ref 0.7–4.0)
MCH: 31 pg (ref 26.0–34.0)
MCHC: 32.8 g/dL (ref 30.0–36.0)
MCV: 94.4 fL (ref 80.0–100.0)
Monocytes Absolute: 0.4 10*3/uL (ref 0.1–1.0)
Monocytes Relative: 7 %
Neutro Abs: 2.7 10*3/uL (ref 1.7–7.7)
Neutrophils Relative %: 49 %
Platelets: 229 10*3/uL (ref 150–400)
RBC: 5.04 MIL/uL (ref 4.22–5.81)
RDW: 11.9 % (ref 11.5–15.5)
WBC: 5.4 10*3/uL (ref 4.0–10.5)
nRBC: 0 % (ref 0.0–0.2)

## 2019-05-20 LAB — HIV ANTIBODY (ROUTINE TESTING W REFLEX): HIV Screen 4th Generation wRfx: NONREACTIVE

## 2019-05-20 SURGERY — LAPAROSCOPIC CHOLECYSTECTOMY
Anesthesia: General | Site: Abdomen

## 2019-05-20 MED ORDER — PHENYLEPHRINE 40 MCG/ML (10ML) SYRINGE FOR IV PUSH (FOR BLOOD PRESSURE SUPPORT)
PREFILLED_SYRINGE | INTRAVENOUS | Status: AC
  Filled 2019-05-20: qty 10

## 2019-05-20 MED ORDER — ROCURONIUM BROMIDE 10 MG/ML (PF) SYRINGE
PREFILLED_SYRINGE | INTRAVENOUS | Status: AC
  Filled 2019-05-20: qty 10

## 2019-05-20 MED ORDER — EPHEDRINE 5 MG/ML INJ
INTRAVENOUS | Status: AC
  Filled 2019-05-20: qty 10

## 2019-05-20 MED ORDER — ONDANSETRON HCL 4 MG/2ML IJ SOLN
INTRAMUSCULAR | Status: AC
  Filled 2019-05-20: qty 8

## 2019-05-20 MED ORDER — PROPOFOL 10 MG/ML IV BOLUS
INTRAVENOUS | Status: DC | PRN
  Administered 2019-05-20: 170 mg via INTRAVENOUS

## 2019-05-20 MED ORDER — HYDROMORPHONE HCL 1 MG/ML IJ SOLN
0.2500 mg | INTRAMUSCULAR | Status: DC | PRN
  Administered 2019-05-20 (×4): 0.5 mg via INTRAVENOUS
  Filled 2019-05-20 (×4): qty 0.5

## 2019-05-20 MED ORDER — SUCCINYLCHOLINE CHLORIDE 200 MG/10ML IV SOSY
PREFILLED_SYRINGE | INTRAVENOUS | Status: AC
  Filled 2019-05-20: qty 10

## 2019-05-20 MED ORDER — BUPIVACAINE HCL (PF) 0.5 % IJ SOLN
INTRAMUSCULAR | Status: AC
  Filled 2019-05-20: qty 30

## 2019-05-20 MED ORDER — BUPIVACAINE HCL (PF) 0.5 % IJ SOLN
INTRAMUSCULAR | Status: DC | PRN
  Administered 2019-05-20: 10 mL

## 2019-05-20 MED ORDER — FENTANYL CITRATE (PF) 100 MCG/2ML IJ SOLN
INTRAMUSCULAR | Status: DC | PRN
  Administered 2019-05-20 (×2): 50 ug via INTRAVENOUS
  Administered 2019-05-20: 100 ug via INTRAVENOUS

## 2019-05-20 MED ORDER — ONDANSETRON HCL 4 MG/2ML IJ SOLN
INTRAMUSCULAR | Status: DC | PRN
  Administered 2019-05-20: 4 mg via INTRAVENOUS

## 2019-05-20 MED ORDER — SEVOFLURANE IN SOLN
RESPIRATORY_TRACT | Status: AC
  Filled 2019-05-20: qty 250

## 2019-05-20 MED ORDER — EPHEDRINE SULFATE 50 MG/ML IJ SOLN
INTRAMUSCULAR | Status: DC | PRN
  Administered 2019-05-20: 10 mg via INTRAVENOUS

## 2019-05-20 MED ORDER — ACETAMINOPHEN 500 MG PO TABS
1000.0000 mg | ORAL_TABLET | Freq: Four times a day (QID) | ORAL | Status: DC
  Administered 2019-05-20 – 2019-05-21 (×3): 1000 mg via ORAL
  Filled 2019-05-20 (×3): qty 2

## 2019-05-20 MED ORDER — HEMOSTATIC AGENTS (NO CHARGE) OPTIME
TOPICAL | Status: DC | PRN
  Administered 2019-05-20: 1 via TOPICAL

## 2019-05-20 MED ORDER — SODIUM CHLORIDE 0.9 % IR SOLN
Status: DC | PRN
  Administered 2019-05-20: 1

## 2019-05-20 MED ORDER — LIDOCAINE HCL (CARDIAC) PF 50 MG/5ML IV SOSY
PREFILLED_SYRINGE | INTRAVENOUS | Status: DC | PRN
  Administered 2019-05-20: 40 mg via INTRAVENOUS

## 2019-05-20 MED ORDER — ONDANSETRON HCL 4 MG/2ML IJ SOLN
INTRAMUSCULAR | Status: AC
  Filled 2019-05-20: qty 2

## 2019-05-20 MED ORDER — PHENYLEPHRINE HCL (PRESSORS) 10 MG/ML IV SOLN
INTRAVENOUS | Status: DC | PRN
  Administered 2019-05-20 (×2): 80 ug via INTRAVENOUS

## 2019-05-20 MED ORDER — PROPOFOL 10 MG/ML IV BOLUS
INTRAVENOUS | Status: AC
  Filled 2019-05-20: qty 20

## 2019-05-20 MED ORDER — ROCURONIUM 10MG/ML (10ML) SYRINGE FOR MEDFUSION PUMP - OPTIME
INTRAVENOUS | Status: DC | PRN
  Administered 2019-05-20: 45 mg via INTRAVENOUS

## 2019-05-20 MED ORDER — ONDANSETRON 4 MG PO TBDP: 4.0000 mg | ORAL_TABLET | Freq: Four times a day (QID) | ORAL | 0 refills | Status: DC | PRN

## 2019-05-20 MED ORDER — SUGAMMADEX SODIUM 200 MG/2ML IV SOLN
INTRAVENOUS | Status: DC | PRN
  Administered 2019-05-20: 200 mg via INTRAVENOUS

## 2019-05-20 MED ORDER — DOCUSATE SODIUM 100 MG PO CAPS: 100.0000 mg | ORAL_CAPSULE | Freq: Two times a day (BID) | ORAL | 0 refills | Status: DC

## 2019-05-20 MED ORDER — KETOROLAC TROMETHAMINE 30 MG/ML IJ SOLN
30.0000 mg | Freq: Once | INTRAMUSCULAR | Status: AC
  Administered 2019-05-20: 14:00:00 30 mg via INTRAVENOUS

## 2019-05-20 MED ORDER — OXYCODONE HCL 5 MG PO TABS: 5.0000 mg | ORAL_TABLET | ORAL | 0 refills | Status: DC | PRN

## 2019-05-20 MED ORDER — MEPERIDINE HCL 50 MG/ML IJ SOLN: 6.2500 mg | INTRAMUSCULAR | Status: DC | PRN

## 2019-05-20 MED ORDER — PROMETHAZINE HCL 25 MG/ML IJ SOLN: 6.2500 mg | INTRAMUSCULAR | Status: DC | PRN

## 2019-05-20 MED ORDER — LACTATED RINGERS IV SOLN: Freq: Once | INTRAVENOUS | Status: DC

## 2019-05-20 MED ORDER — FENTANYL CITRATE (PF) 250 MCG/5ML IJ SOLN
INTRAMUSCULAR | Status: AC
  Filled 2019-05-20: qty 5

## 2019-05-20 MED ORDER — KETOROLAC TROMETHAMINE 30 MG/ML IJ SOLN
INTRAMUSCULAR | Status: AC
  Filled 2019-05-20: qty 1

## 2019-05-20 MED ORDER — IBUPROFEN 100 MG/5ML PO SUSP
800.0000 mg | Freq: Four times a day (QID) | ORAL | Status: DC
  Administered 2019-05-20 – 2019-05-21 (×2): 800 mg via ORAL
  Filled 2019-05-20 (×3): qty 40

## 2019-05-20 SURGICAL SUPPLY — 45 items
ADH SKN CLS APL DERMABOND .7 (GAUZE/BANDAGES/DRESSINGS) ×1
APL PRP STRL LF DISP 70% ISPRP (MISCELLANEOUS) ×1
APPLIER CLIP ROT 10 11.4 M/L (STAPLE) ×2
APR CLP MED LRG 11.4X10 (STAPLE) ×1
BAG RETRIEVAL 10 (BASKET) ×1
BLADE SURG 15 STRL LF DISP TIS (BLADE) ×1 IMPLANT
BLADE SURG 15 STRL SS (BLADE) ×2
CHLORAPREP W/TINT 26 (MISCELLANEOUS) ×2 IMPLANT
CLIP APPLIE ROT 10 11.4 M/L (STAPLE) ×1 IMPLANT
CLOTH BEACON ORANGE TIMEOUT ST (SAFETY) ×2 IMPLANT
COVER LIGHT HANDLE STERIS (MISCELLANEOUS) ×4 IMPLANT
COVER WAND RF STERILE (DRAPES) ×2 IMPLANT
DECANTER SPIKE VIAL GLASS SM (MISCELLANEOUS) ×2 IMPLANT
DERMABOND ADVANCED (GAUZE/BANDAGES/DRESSINGS) ×1
DERMABOND ADVANCED .7 DNX12 (GAUZE/BANDAGES/DRESSINGS) ×1 IMPLANT
ELECT REM PT RETURN 9FT ADLT (ELECTROSURGICAL) ×2
ELECTRODE REM PT RTRN 9FT ADLT (ELECTROSURGICAL) ×1 IMPLANT
FILTER SMOKE EVAC LAPAROSHD (FILTER) ×2 IMPLANT
GLOVE BIO SURGEON STRL SZ 6.5 (GLOVE) ×3 IMPLANT
GLOVE BIOGEL PI IND STRL 6.5 (GLOVE) ×1 IMPLANT
GLOVE BIOGEL PI IND STRL 7.0 (GLOVE) ×3 IMPLANT
GLOVE BIOGEL PI INDICATOR 6.5 (GLOVE) ×1
GLOVE BIOGEL PI INDICATOR 7.0 (GLOVE) ×3
GOWN STRL REUS W/TWL LRG LVL3 (GOWN DISPOSABLE) ×6 IMPLANT
HEMOSTAT SNOW SURGICEL 2X4 (HEMOSTASIS) ×2 IMPLANT
INST SET LAPROSCOPIC AP (KITS) ×2 IMPLANT
KIT TURNOVER KIT A (KITS) ×2 IMPLANT
MANIFOLD NEPTUNE II (INSTRUMENTS) ×2 IMPLANT
NDL INSUFFLATION 14GA 120MM (NEEDLE) ×1 IMPLANT
NEEDLE INSUFFLATION 14GA 120MM (NEEDLE) ×2 IMPLANT
NS IRRIG 1000ML POUR BTL (IV SOLUTION) ×2 IMPLANT
PACK LAP CHOLE LZT030E (CUSTOM PROCEDURE TRAY) ×2 IMPLANT
PAD ARMBOARD 7.5X6 YLW CONV (MISCELLANEOUS) ×2 IMPLANT
SET BASIN LINEN APH (SET/KITS/TRAYS/PACK) ×2 IMPLANT
SET TUBE SMOKE EVAC HIGH FLOW (TUBING) ×2 IMPLANT
SLEEVE ENDOPATH XCEL 5M (ENDOMECHANICALS) ×2 IMPLANT
SUT MNCRL AB 4-0 PS2 18 (SUTURE) ×3 IMPLANT
SUT VICRYL 0 UR6 27IN ABS (SUTURE) ×2 IMPLANT
SYS BAG RETRIEVAL 10MM (BASKET) ×1
SYSTEM BAG RETRIEVAL 10MM (BASKET) ×1 IMPLANT
TROCAR ENDO BLADELESS 11MM (ENDOMECHANICALS) ×2 IMPLANT
TROCAR XCEL NON-BLD 5MMX100MML (ENDOMECHANICALS) ×2 IMPLANT
TROCAR XCEL UNIV SLVE 11M 100M (ENDOMECHANICALS) ×2 IMPLANT
TUBE CONNECTING 12X1/4 (SUCTIONS) ×2 IMPLANT
WARMER LAPAROSCOPE (MISCELLANEOUS) ×2 IMPLANT

## 2019-05-20 NOTE — Anesthesia Procedure Notes (Signed)
Procedure Name: Intubation Date/Time: 05/20/2019 1:14 PM Performed by: Ollen Bowl, CRNA Pre-anesthesia Checklist: Patient identified, Patient being monitored, Timeout performed, Emergency Drugs available and Suction available Patient Re-evaluated:Patient Re-evaluated prior to induction Oxygen Delivery Method: Circle system utilized Preoxygenation: Pre-oxygenation with 100% oxygen Induction Type: IV induction Ventilation: Mask ventilation without difficulty Laryngoscope Size: Mac and 3 Grade View: Grade I Tube type: Oral Tube size: 7.5 mm Number of attempts: 1 Airway Equipment and Method: Stylet Placement Confirmation: ETT inserted through vocal cords under direct vision,  positive ETCO2 and breath sounds checked- equal and bilateral Secured at: 21 cm Tube secured with: Tape Dental Injury: Teeth and Oropharynx as per pre-operative assessment

## 2019-05-20 NOTE — Interval H&P Note (Signed)
History and Physical Interval Note:  05/20/2019 12:18 PM  Patrick Ray  has presented today for surgery, with the diagnosis of acute cholecystitis.  The various methods of treatment have been discussed with the patient and family. After consideration of risks, benefits and other options for treatment, the patient has consented to  Procedure(s): LAPAROSCOPIC CHOLECYSTECTOMY (N/A) as a surgical intervention.  The patient's history has been reviewed, patient examined, no change in status, stable for surgery.  I have reviewed the patient's chart and labs.  Questions were answered to the patient's satisfaction.    Patient with acute cholecystitis on [place]. Normal LFTs. Plan for laparoscopic cholecystectomy.   Virl Cagey

## 2019-05-20 NOTE — Anesthesia Preprocedure Evaluation (Signed)
Anesthesia Evaluation  Patient identified by MRN, date of birth, ID band Patient awake    Reviewed: Allergy & Precautions, NPO status , Patient's Chart, lab work & pertinent test results  History of Anesthesia Complications Negative for: history of anesthetic complications  Airway Mallampati: III  TM Distance: >3 FB     Dental   Crown, fillings:   Pulmonary former smoker,    Pulmonary exam normal breath sounds clear to auscultation       Cardiovascular Exercise Tolerance: Good Normal cardiovascular exam Rhythm:Regular Rate:Normal     Neuro/Psych Seizures - (last seizure - 5 or 6 years ago), Well Controlled,  PSYCHIATRIC DISORDERS Anxiety Cerebral palsy    GI/Hepatic GERD  ,  Endo/Other  negative endocrine ROS  Renal/GU negative Renal ROS     Musculoskeletal   Abdominal   Peds  Hematology negative hematology ROS (+)   Anesthesia Other Findings   Reproductive/Obstetrics                            Anesthesia Physical Anesthesia Plan  ASA: II  Anesthesia Plan: General   Post-op Pain Management:    Induction:   PONV Risk Score and Plan:   Airway Management Planned: Oral ETT  Additional Equipment:   Intra-op Plan:   Post-operative Plan: Extubation in OR  Informed Consent: I have reviewed the patients History and Physical, chart, labs and discussed the procedure including the risks, benefits and alternatives for the proposed anesthesia with the patient or authorized representative who has indicated his/her understanding and acceptance.     Dental advisory given  Plan Discussed with: CRNA  Anesthesia Plan Comments:        Anesthesia Quick Evaluation

## 2019-05-20 NOTE — Telephone Encounter (Signed)
-----   Message from Worthy Rancher, MD sent at 05/20/2019  4:25 PM EDT ----- Regarding: FW: Incidental Hemangioma and adrenal Adenoma on CT Janett Billow can you get this patient scheduled for an appointment so we can discuss this and document it and get the MRI ordered.  As soon as is possible Caryl Pina, MD [facility] 05/20/2019, 4:25 PM   ----- Message ----- From: Virl Cagey, MD Sent: 05/20/2019   3:16 PM EDT To: Fransisca Kaufmann Dettinger, MD Subject: Incidental Hemangioma and adrenal Adenoma on#  Hey,  I took out Kastin's gallbladder for acute cholecystitis. CT showed incidental hemangioma and adrenal adenoma at 1cm.  The radiologist recommended further imaging, probably MRI would be best.  Wanted to see if you could get that ordered for him in the upcoming weeks.  Beckie Busing, MD [facility] [address] [place], [place] [address] [phone] (office)

## 2019-05-20 NOTE — Plan of Care (Signed)

## 2019-05-20 NOTE — Anesthesia Postprocedure Evaluation (Signed)
Anesthesia Post Note  Patient: Patrick Ray  Procedure(s) Performed: LAPAROSCOPIC CHOLECYSTECTOMY (N/A Abdomen)  Patient location during evaluation: PACU Anesthesia Type: General Level of consciousness: awake and alert and oriented Pain management: pain level controlled Vital Signs Assessment: post-procedure vital signs reviewed and stable Respiratory status: spontaneous breathing Cardiovascular status: blood pressure returned to baseline and stable Postop Assessment: patient able to bend at knees Anesthetic complications: no     Last Vitals:  Vitals:   05/20/19 0539 05/20/19 1415  BP: (!) 144/92 (!) 141/83  Pulse: (!) 59 82  Resp:  13  Temp:  36.8 C  SpO2: 95% 96%    Last Pain:  Vitals:   05/20/19 1415  TempSrc:   PainSc: 8                  Carrell Rahmani

## 2019-05-20 NOTE — Op Note (Signed)
Operative Note   Preoperative Diagnosis: Acute cholecystitis    Postoperative Diagnosis: Same   Procedure(s) Performed: Laparoscopic cholecystectomy   Surgeon: Ria Comment C. Constance Haw, MD   Assistants: Aviva Signs, MD    Anesthesia: General endotracheal   Anesthesiologist: Denese Killings, MD    Specimens: Gallbladder    Estimated Blood Loss: Minimal    Blood Replacement: None    Complications: None    Operative Findings: Distended gallbladder with edema    Procedure: The patient was taken to the operating room and placed supine. General endotracheal anesthesia was induced. Intravenous antibiotics were administered per protocol. An orogastric tube positioned to decompress the stomach. The abdomen was prepared and draped in the usual sterile fashion.    A supraumbilical incision was made and a Veress technique was utilized to achieve pneumoperitoneum to 15 mmHg with carbon dioxide. A 11 mm optiview port was placed through the supraumbilical region, and a 10 mm 0-degree operative laparoscope was introduced. The area underlying the trocar and Veress needle were inspected and without evidence of injury.  Remaining trocars were placed under direct vision. Two 5 mm ports were placed in the right abdomen, between the anterior axillary and midclavicular line.  A final 11 mm port was placed through the mid-epigastrium, near the falciform ligament.    The gallbladder was dilated and edematous. The gallbladder fundus was elevated cephalad and the infundibulum was retracted to the patient's right. The gallbladder/cystic duct junction was skeletonized. The cystic artery noted in the triangle of Calot and was also skeletonized.  We then continued liberal medial and lateral dissection until the critical view of safety was achieved.    There was a tiny structure lateral to the cystic duct that appeared to potentially be a vein, and this was doubly clipped and divided. The cystic duct and cystic  artery were doubly clipped and divided. The gallbladder was then dissected from the liver bed with electrocautery. The specimen was placed in an Endopouch and was retrieved through the epigastric site.   Final inspection revealed acceptable hemostasis. Surgical Emogene Morgan was placed in the gallbladder bed. Trocars were removed and pneumoperitoneum was released. 0 Vicryl fascial sutures were used to close the epigastric and umbilical port sites.  Skin incisions were closed with 4-0 Monocryl subcuticular sutures and Dermabond. The patient was awakened from anesthesia and extubated without complication.    Curlene Labrum, MD [facility] [address] [place], [place] [address] [phone] (office)

## 2019-05-20 NOTE — Telephone Encounter (Signed)
Called patient and he states he is still in the hospital and will call when he gets out to scheduled appointment.

## 2019-05-20 NOTE — Discharge Instructions (Signed)
Discharge Laparoscopic Surgery Instructions:  Common Complaints: Right shoulder pain is common after laparoscopic surgery. This is secondary to the gas used in the surgery being trapped under the diaphragm.  Walk to help your body absorb the gas. This will improve in a few days. Pain at the port sites are common, especially the larger port sites. This will improve with time.  Some nausea is common and poor appetite. The main goal is to stay hydrated the first few days after surgery.   Diet/ Activity: Diet as tolerated. You may not have an appetite, but it is important to stay hydrated. Drink 64 ounces of water a day. Your appetite will return with time.  Shower per your regular routine daily.  Do not take hot showers. Take warm showers that are less than 10 minutes. Rest and listen to your body, but do not remain in bed all day.  Walk everyday for at least 15-20 minutes. Deep cough and move around every 1-2 hours in the first few days after surgery.  Do not lift > 10 lbs, perform excessive bending, pushing, pulling, squatting for 1-2 weeks after surgery.  Do not pick at the dermabond glue on your incision sites.  This glue film will remain in place for 1-2 weeks and will start to peel off.  Do not place lotions or balms on your incision unless instructed to specifically by Dr. Constance Haw.   Medication: Take tylenol and ibuprofen as needed for pain control, alternating every 4-6 hours.  Example:  Tylenol 108m @ 6am, 12noon, 6pm, 136mnight (Do not exceed 400012mf tylenol a day). Ibuprofen 800m22m9am, 3pm, 9pm, 3am (Do not exceed 3600mg48mibuprofen a day).  Take Roxicodone for breakthrough pain every 4 hours.  Take Colace for constipation related to narcotic pain medication. If you do not have a bowel movement in 2 days, take Miralax over the counter.  Drink plenty of water to also prevent constipation.   Contact Information: If you have questions or concerns, please call our office,  [phone]- Thursday 8AM-5PM and Friday 8AM-12Noon.  If it is after hours or on the weekend, please call [facility], [phone] ask to speak to the surgeon on call for Dr. BridgConstance HawnnieClear [facility], Care After This sheet gives you information about how to care for yourself after your procedure. Your doctor may also give you more specific instructions. If you have problems or questions, contact your doctor. Follow these instructions at home: Care for cuts from surgery (incisions)   Follow instructions from your doctor about how to take care of your cuts from surgery. Make sure you: ? Wash your hands with soap and water before you change your bandage (dressing). If you cannot use soap and water, use hand sanitizer. ? Change your bandage as told by your doctor. ? Leave stitches (sutures), skin glue, or skin tape (adhesive) strips in place. They may need to stay in place for 2 weeks or longer. If tape strips get loose and curl up, you may trim the loose edges. Do not remove tape strips completely unless your doctor says it is okay.  Do not take baths, swim, or use a hot tub until your doctor says it is okay.   You may shower.  Check your surgical cut area every day for signs of infection. Check for: ? More redness, swelling, or pain. ? More fluid or blood. ? Warmth. ? Pus or a bad smell. Activity  Do  not drive or use heavy machinery while taking prescription pain medicine.  Do not lift anything that is heavier than 10 lb (4.5 kg) until your doctor says it is okay.  Do not play contact sports until your doctor says it is okay.  Do not drive for 24 hours if you were given a medicine to help you relax (sedative).  Rest as needed. Do not return to work or school until your doctor says it is okay. General instructions  Take over-the-counter and prescription medicines only as told by your doctor.  To prevent or treat constipation  while you are taking prescription pain medicine, your doctor may recommend that you: ? Drink enough fluid to keep your pee (urine) clear or pale yellow. ? Take over-the-counter or prescription medicines. ? Eat foods that are high in fiber, such as fresh fruits and vegetables, whole grains, and beans. ? Limit foods that are high in fat and processed sugars, such as fried and sweet foods. Contact a doctor if:  You develop a rash.  You have more redness, swelling, or pain around your surgical cuts.  You have more fluid or blood coming from your surgical cuts.  Your surgical cuts feel warm to the touch.  You have pus or a bad smell coming from your surgical cuts.  You have a fever.  One or more of your surgical cuts breaks open. Get help right away if:  You have trouble breathing.  You have chest pain.  You have pain that is getting worse in your shoulders.  You faint or feel dizzy when you stand.  You have very bad pain in your belly (abdomen).  You are sick to your stomach (nauseous) for more than one day.  You have throwing up (vomiting) that lasts for more than one day.  You have leg pain. This information is not intended to replace advice given to you by your health care provider. Make sure you discuss any questions you have with your health care provider. Document Released: 04/19/2008 Document Revised: 06/23/2017 Document Reviewed: 12/28/2015 Elsevier Patient Education  2020 [facility] Prescribed you Omeprazole for heartburn. You can try this medication if you want for reflux type symptoms.  Heartburn Heartburn is a type of pain or discomfort that can happen in the throat or chest. It is often described as a burning pain. It may also cause a bad, acid-like taste in the mouth. Heartburn may feel worse when you lie down or bend over. It may be worse at night. It may be caused by stomach contents that move back up (reflux) into the tube that connects the mouth  with the stomach (esophagus). Follow these instructions at home: Eating and drinking   Avoid certain foods and drinks as told by your doctor. This may include: ? Coffee and tea (with or without caffeine). ? Drinks that have alcohol. ? Energy drinks and sports drinks. ? Carbonated drinks or sodas. ? Chocolate and cocoa. ? Peppermint and mint flavorings. ? Garlic and onions. ? Horseradish. ? Spicy and acidic foods, such as:  Peppers.  Chili powder and curry powder.  Vinegar.  Hot sauces and BBQ sauce. ? Citrus fruit juices and citrus fruits, such as:  Oranges.  Lemons.  Limes. ? Tomato-based foods, such as:  Red sauce and pizza with red sauce.  Chili.  Salsa. ? Fried and fatty foods, such as:  Donuts.  [place] fries and potato chips.  High-fat dressings. ? High-fat meats, such as:  Hot  dogs and sausage.  Rib eye steak.  Ham and bacon. ? High-fat dairy items, such as:  Whole milk.  Butter.  Cream cheese.  Eat small meals often. Avoid eating large meals.  Avoid drinking large amounts of liquid with your meals.  Avoid eating meals during the 2-3 hours before bedtime.  Avoid lying down right after you eat.  Do not exercise right after you eat. Lifestyle      If you are overweight, lose an amount of weight that is healthy for you. Ask your doctor about a safe weight loss goal.  Do not use any products that contain nicotine or tobacco, including cigarettes, e-cigarettes, and chewing tobacco. These can make your symptoms worse. If you need help quitting, ask your doctor.  Wear loose clothes. Do not wear anything tight around your waist.  Raise (elevate) the head of your bed about 6 inches (15 cm) when you sleep.  Try to lower your stress. If you need help doing this, ask your doctor. General instructions  Pay attention to any changes in your symptoms.  Take over-the-counter and prescription medicines only as told by your doctor. ? Do not  take aspirin, ibuprofen, or other NSAIDs unless your doctor says it is okay. ? Stop medicines only as told by your doctor.  Keep all follow-up visits as told by your doctor. This is important. Contact a doctor if:  You have new symptoms.  You lose weight and you do not know why it is happening.  You have trouble swallowing, or it hurts to swallow.  You have wheezing or a cough that keeps happening.  Your symptoms do not get better with treatment.  You have heartburn often for more than 2 weeks. Get help right away if:  You have pain in your arms, neck, jaw, teeth, or back.  You feel sweaty, dizzy, or light-headed.  You have chest pain or shortness of breath.  You throw up (vomit) and your throw up looks like blood or coffee grounds.  Your poop (stool) is bloody or black. These symptoms may represent a serious problem that is an emergency. Do not wait to see if the symptoms will go away. Get medical help right away. Call your local emergency services (911 in the [place] Do not drive yourself to the hospital. Summary  Heartburn is a type of pain that can happen in the throat or chest. It can feel like a burning pain. It may also cause a bad, acid-like taste in the mouth.  You may need to avoid certain foods and drinks to help your symptoms. Ask your doctor what foods and drinks you should avoid.  Take over-the-counter and prescription medicines only as told by your doctor. Do not take aspirin, ibuprofen, or other NSAIDs unless your doctor told you to do so.  Contact your doctor if your symptoms do not get better or they get worse. This information is not intended to replace advice given to you by your health care provider. Make sure you discuss any questions you have with your health care provider. Document Released: 03/23/2011 Document Revised: 12/11/2017 Document Reviewed: 12/11/2017 Elsevier Patient Education  [employer].   Dr. 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  Incidental Liver Hemangioma on CT Noted: Liver Hemangioma  What is a liver hemangioma? A liver hemangioma, also known as a hepatic hemangioma, is a benign (non-cancerous) tumor in the liver that is made up of clusters of blood-filled cavities fed  by the hepatic (liver) artery. Usually, a patient has only one hemangioma, but in some cases there may be more than one. Hemangiomas do not develop into cancer and do not spread to other areas of the body.  How common are liver hemangiomas? Liver hemangioma is the most common benign (non-cancerous) liver tumor, affecting up to 5% of adults in the [place].  Who is affected by liver hemangiomas? Liver hemangiomas are more common in adults than in children; the typical age at diagnosis is 30-50 years, but they can happen at any age. Liver hemangiomas occur more often in women than in men, but can affect both.  What causes a liver hemangioma? The causes of liver hemangiomas are not known. Some cases may be genetic (runs in the family).  What are the symptoms of a liver hemangioma? Most liver hemangiomas do not cause symptoms, and are only discovered when the patient is being seen for another, unrelated health condition.  Small (a few millimeters to 2 centimeters in diameter) and medium (2 to 5 centimeters) hemangiomas usually do not cause symptoms, but should be followed regularly by a doctor. Such monitoring is needed because about 10% of hemangiomas increase in size over time, for unknown reasons.  Giant liver hemangiomas (more than 10 centimeters) usually develop symptoms and complications that require treatment. Symptoms most often include upper abdominal pain, as the large mass presses against surrounding the liver tissue and capsule. Other symptoms include:  Poor appetite Feeling full quickly when eating a meal Nausea Vomiting Feeling bloated after eating Diagnosis and Tests How is a  liver hemangioma diagnosed? A liver hemangioma is not usually discovered during a routine physical exam or laboratory testing. Diagnosis usually results from imaging studies that are being done for a different condition.  The imaging techniques that can single out liver hemangioma from other types of tumors include:  Contrast-enhanced ultrasound (high-frequency sound waves are sent through body tissues and the echoes are recorded and transformed into video or photos) Computed tomography (CT) (X-rays and computers produce images of a cross-section of the body) Magnetic resonance imaging (MRI) (a large magnet, radio waves and a computer produce images) Angiography (X-ray contrast to look at the blood vessels in the body) Scintigraphy (a nuclear scan that uses a radioactive trace material called Technetium-74m to form an image of the hemangioma). Some hemangiomas are diagnosed at birth or early childhood (in up to 61-68% of one-year-olds). The hemangioma will usually get smaller over the course of time, and might disappear in some cases.  Management and Treatment How are liver hemangiomas treated? If a liver hemangioma is small, stable and causes no symptoms, it can be monitored with imaging studies every six to 12 months.  There are no drug treatments for a liver hemangioma. Surgery may be needed to remove the hemangioma if it grows rapidly or causes significant discomfort or pain.  A technique called embolization, in which the blood vessels that feed the hemangioma are obstructed, can slow or reverse its growth.  What are the complications associated with a liver hemangioma? Complications depend on the size and location of the hemangioma, and include:  Mechanical complications: Rupture (spontaneous or from physical trauma) Compression (pushing) against surrounding organs such as the stomach (leading to feelings of fullness soon after beginning a meal); bile ducts (leading to jaundice); or the  liver capsule (which causes pain) Bleeding complications, either inside the tumor, or outside the tumor into the abdominal cavity Degenerative complications, such as blood clotting inside the  hemangioma, or the development of calcifications (calcium deposits in the tumor) or scar tissue Prevention Can a liver hemangioma be prevented? Since the cause of liver hemangiomas is not known, there is no way to prevent them.  Who is at risk of developing a liver hemangioma? People who are greater risk for a liver hemangioma include:  Adults Those who have used certain medications, such as steroids, for a long time Females and factors associated with being male such as: Estrogen therapy Use of oral contraceptives Pregnancy Giving birth two or more times Hormone therapy to treat symptoms of menopause Ovarian stimulation treatments. Those who use chemicals produced by sex hormones that may encourage the growth of a hemangioma References:  Risk analyst. Benign Liver Tumors (https://www.liverfoundation.org/for-patients/about-the-liver/diseases-of-the-liver/benign-liver-tumors/). Accessed 03/17/2017. [employer] Clinical cytogeneticist Version. Hemangiomas of the Liver (https://www.merckmanuals.com/home/liver-and-gallbladder-disorders/tumors-of-the-liver/hemangiomas-of-the-liver). Accessed 03/17/2017. Lina Sar V, S?vulescu F, et al. Hepatic hemangioma -review-. J Med Life. 2015; 8(Spec Issue): 4-11.   Incidental Adrenal Adenoma found on CT:  Adrenal Adenoma  An adrenal adenoma is a benign tumor of the glands that are located on top of each kidney (adrenal glands). These glands produce hormones. A benign tumor means that the growth is not cancer. A person may have one or more tumors in one or both glands. In almost all cases, adrenal adenomas do not cause any symptoms. These are called nonfunctional adenomas. In rare cases, an adenoma may produce high levels of hormones called cortisol or  aldosterone. These tumors are called functional adenomas. Adrenal adenomas become more common as people grow older, but the tumors do not become cancer. However, nonfunctional adenomas may become functional. What are the causes? In most cases, the cause of this condition is not known. In very rare cases, the condition may be passed from parent to child (inherited). These include multiple endocrine neoplasia type 1 and familial adenomatous polyposis. These conditions cause adenomas in many areas of the body. What are the signs or symptoms? Symptoms of this condition depend on the type of adrenal adenoma that you have.  Nonfunctional adrenal adenomas usually do not cause any symptoms.  Symptoms of functional adrenal adenomas depend on which hormone is produced in high levels. ? Tumors that secrete cortisol cause a condition called Cushing's syndrome. Signs and symptoms include:  Increased fat in the upper body.  Tiredness and loss of energy.  Muscle weakness.  High blood pressure.  High blood sugar.  Bruising and purple stretch marks in the skin, usually on the upper body.  Facial hair, acne, and menstrual irregularities in women. ? Tumors that secrete aldosterone cause a condition called primary aldosteronism. Signs and symptoms include:  High blood pressure that may be difficult to control.  Tiredness and loss of energy.  Headache.  Weakness or numbness.  Low potassium, low magnesium, or high sodium levels in your blood. How is this diagnosed? This condition may be diagnosed based on:  Your symptoms. Your health care provider may suspect the condition if you have signs and symptoms of a functional adenoma.  A physical exam.  Blood and urine tests to check for high levels of hormones.  Imaging studies to confirm the diagnosis. These may include: ? CT scan. ? MRI. ? PET scan.  Biopsy. For this test, a sample of the tumor is removed and examined in a lab. This is done  in rare cases where other tests have not given a clear result. Adrenal adenomas are often found by chance when imaging studies of the abdomen are done for  other reasons. How is this treated? Treatment for this condition depends on the type of the adrenal adenoma that you have. You may treated with:  Observation. This is done if you have a nonfunctional adrenal adenoma. For observation, you may need: ? Regular imaging studies to make sure the tumor is not growing. ? Blood or urine tests to make sure the tumor is not becoming functional.  Surgery. This is done if you have a functional adenoma. Surgery is the main treatment for this condition and usually cures it.  Medicines. These are used if surgery is not possible. The medicines block the effects of the hormones. Follow these instructions at home:  Take over-the-counter and prescription medicines only as told by your health care provider.  Return to your normal activities as told by your health care provider. Ask your health care provider what activities are safe for you.  Keep all follow-up visits as told by your health care provider. This is important. This may include visits for regular tests and imaging studies. Contact a health care provider if:  You develop any of the signs or symptoms of Cushing's syndrome or primary aldosteronism. Summary  An adrenal adenoma is a benign tumor of the adrenal gland.  Nonfunctional adenomas rarely cause symptoms and do not need to be treated.  Functional adenomas may cause symptoms of Cushing's syndrome or primary aldosteronism.  Adrenal adenomas do not become cancerous. Nonfunctional adenomas may become functional.  Surgery to remove the tumor is the usual treatment for functional adenomas. This information is not intended to replace advice given to you by your health care provider. Make sure you discuss any questions you have with your health care provider. Document Released: 07/19/2018  Document Revised: 07/19/2018 Document Reviewed: 07/19/2018 Elsevier Patient Education  2020 [employer].

## 2019-05-20 NOTE — Transfer of Care (Signed)
Immediate Anesthesia Transfer of Care Note  Patient: Patrick Ray  Procedure(s) Performed: LAPAROSCOPIC CHOLECYSTECTOMY (N/A Abdomen)  Patient Location: PACU  Anesthesia Type:General  Level of Consciousness: awake and alert   Airway & Oxygen Therapy: Patient Spontanous Breathing  Post-op Assessment: Report given to RN  Post vital signs: Reviewed and stable  Last Vitals:  Vitals Value Taken Time  BP 141/83 05/20/19 1415  Temp    Pulse 96 05/20/19 1419  Resp 18 05/20/19 1419  SpO2 91 % 05/20/19 1419  Vitals shown include unvalidated device data.  Last Pain:  Vitals:   05/20/19 0953  TempSrc:   PainSc: 2          Complications: No apparent anesthesia complications

## 2019-05-21 ENCOUNTER — Encounter (HOSPITAL_COMMUNITY): Payer: Self-pay | Admitting: General Surgery

## 2019-05-21 DIAGNOSIS — K810 Acute cholecystitis: Secondary | ICD-10-CM | POA: Diagnosis not present

## 2019-05-21 LAB — SURGICAL PATHOLOGY

## 2019-05-21 NOTE — Discharge Summary (Signed)
Physician Discharge Summary  Patient ID: Patrick Ray MRN: [id number] DOB/AGE: [birth year] 38 y.o.  Admit date: 05/19/2019 Discharge date: 05/22/2019  Admission Diagnoses: Acute cholecystitis   Discharge Diagnoses:  Active Problems:   Acute cholecystitis   Discharged Condition: good  Hospital Course: Patrick Ray presented to the hospital with acute onset of abdominal pain and some nausea. He was worked up in the ED and found to have concern for cholecystitis. He was brought in for IV antibiotics, and underwent a laparoscopic cholecystectomy on 10/26. He was having some pain issues after surgery, and stayed overnight to get his pain under control. On POD 1 he was feeling better, and was discharged home.   Consults: None  Significant Diagnostic Studies: CT a/p- acute cholecystitis, [place] RUQ acute cholecystitis, normal CBD   Treatments: IV hydration and Iv antibiotics, and Laparoscopic cholecystectomy   Discharge Exam: Blood pressure 126/74, pulse 64, temperature 98.3 F (36.8 C), temperature source Oral, resp. rate 20, height 5\' 10"  (1.778 m), weight 88.4 kg, SpO2 96 %. General appearance: alert, cooperative and no distress Resp: normal work of breathing GI: soft, non-tender; bowel sounds normal; no masses,  no organomegaly Extremities: extremities normal, atraumatic, no cyanosis or edema  Disposition: Discharge disposition: 01-Home or Self Care       Discharge Instructions    Call MD for:  difficulty breathing, headache or visual disturbances   Complete by: As directed    Call MD for:  extreme fatigue   Complete by: As directed    Call MD for:  persistant dizziness or light-headedness   Complete by: As directed    Call MD for:  persistant nausea and vomiting   Complete by: As directed    Call MD for:  redness, tenderness, or signs of infection (pain, swelling, redness, odor or green/yellow discharge around incision site)   Complete by: As directed    Call MD for:  severe  uncontrolled pain   Complete by: As directed    Call MD for:  temperature >100.4   Complete by: As directed    Discharge patient   Complete by: As directed    Discharge disposition: 01-Home or Self Care   Discharge patient date: 05/21/2019   Increase activity slowly   Complete by: As directed      Allergies as of 05/21/2019      Reactions   Amoxicillin Rash   Penicillins Rash   .Did it involve swelling of the face/tongue/throat, SOB, or low BP? unknown Did it involve sudden or severe rash/hives, skin peeling, or any reaction on the inside of your mouth or nose? yesDid you need to seek medical attention at a hospital or doctor's office? no When did it last happen?childhood If all above answers are "NO", may proceed with cephalosporin use.      Medication List    TAKE these medications   carbamazepine 400 MG 12 hr tablet Commonly known as: TEGRETOL XR Take 400 mg by mouth 3 (three) times daily after meals.   docusate sodium 100 MG capsule Commonly known as: COLACE Take 1 capsule (100 mg total) by mouth 2 (two) times daily.   ibuprofen 200 MG tablet Commonly known as: ADVIL Take 600 mg by mouth every 6 (six) hours as needed for headache or moderate pain.   levETIRAcetam 1000 MG tablet Commonly known as: KEPPRA Take 1,000 mg by mouth 2 (two) times daily.   Melatonin 10 MG Caps Take 1 capsule by mouth at bedtime.   multivitamin per tablet  Take 1 tablet by mouth daily.   omeprazole 20 MG capsule Commonly known as: PRILOSEC Take 1 capsule (20 mg total) by mouth daily.   ondansetron 4 MG disintegrating tablet Commonly known as: ZOFRAN-ODT Take 1 tablet (4 mg total) by mouth every 6 (six) hours as needed for nausea.   oxyCODONE 5 MG immediate release tablet Commonly known as: Oxy IR/ROXICODONE Take 1 tablet (5 mg total) by mouth every 4 (four) hours as needed for severe pain or breakthrough pain.   venlafaxine XR 37.5 MG 24 hr capsule Commonly known as:  Effexor XR Take 1 capsule (37.5 mg total) by mouth daily with breakfast. What changed: when to take this   Viibryd 20 MG Tabs Generic drug: Vilazodone HCl Take 1 tablet by mouth daily.      Follow-up Information    Virl Cagey, MD. Schedule an appointment as soon as possible for a visit on 06/04/2019.   Specialty: General Surgery Why: Dr. Constance Haw will call you to see how you are doing; if you need to be seen in person call the office for an appt  Contact information: [address] [place] [address] [phone]        Dettinger, Fransisca Kaufmann, MD In 1 month.   Specialties: Family Medicine, Cardiology Why: You have an incidental liver hemangioma and adrenal adenoma that needs to be imaged with an MRI that Dr. Warrick Parisian can obtain for you.  Contact information: [place] [address] [phone]           Signed: Virl Cagey 05/22/2019, 10:22 AM

## 2019-05-21 NOTE — Progress Notes (Signed)
Nsg Discharge Note  Admit Date:  05/19/2019 Discharge date: 05/21/2019   Patrick Ray to be D/C'd home per MD order.  AVS completed.  Copy for chart, and copy for patient signed, and dated. Patient/caregiver able to verbalize understanding.  Discharge Medication: Allergies as of 05/21/2019      Reactions   Amoxicillin Rash   Penicillins Rash   .Did it involve swelling of the face/tongue/throat, SOB, or low BP? unknown Did it involve sudden or severe rash/hives, skin peeling, or any reaction on the inside of your mouth or nose? yesDid you need to seek medical attention at a hospital or doctor's office? no When did it last happen?childhood If all above answers are "NO", may proceed with cephalosporin use.      Medication List    TAKE these medications   carbamazepine 400 MG 12 hr tablet Commonly known as: TEGRETOL XR Take 400 mg by mouth 3 (three) times daily after meals.   docusate sodium 100 MG capsule Commonly known as: COLACE Take 1 capsule (100 mg total) by mouth 2 (two) times daily.   ibuprofen 200 MG tablet Commonly known as: ADVIL Take 600 mg by mouth every 6 (six) hours as needed for headache or moderate pain.   levETIRAcetam 1000 MG tablet Commonly known as: KEPPRA Take 1,000 mg by mouth 2 (two) times daily.   Melatonin 10 MG Caps Take 1 capsule by mouth at bedtime.   multivitamin per tablet Take 1 tablet by mouth daily.   omeprazole 20 MG capsule Commonly known as: PRILOSEC Take 1 capsule (20 mg total) by mouth daily.   ondansetron 4 MG disintegrating tablet Commonly known as: ZOFRAN-ODT Take 1 tablet (4 mg total) by mouth every 6 (six) hours as needed for nausea.   oxyCODONE 5 MG immediate release tablet Commonly known as: Oxy IR/ROXICODONE Take 1 tablet (5 mg total) by mouth every 4 (four) hours as needed for severe pain or breakthrough pain.   venlafaxine XR 37.5 MG 24 hr capsule Commonly known as: Effexor XR Take 1 capsule (37.5 mg total)  by mouth daily with breakfast. What changed: when to take this   Viibryd 20 MG Tabs Generic drug: Vilazodone HCl Take 1 tablet by mouth daily.       Discharge Assessment: Vitals:   05/21/19 0000 05/21/19 0612  BP: 128/81 126/74  Pulse: 76 64  Resp: 18 20  Temp: 98.3 F (36.8 C)   SpO2: 95% 95%   Skin clean, dry and intact without evidence of skin break down, no evidence of skin tears noted. IV catheter discontinued intact. Site without signs and symptoms of complications - no redness or edema noted at insertion site, patient denies c/o pain - only slight tenderness at site.  Dressing with slight pressure applied.  D/c Instructions-Education: Discharge instructions given to patient/family with verbalized understanding. D/c education completed with patient/family including follow up instructions, medication list, d/c activities limitations if indicated, with other d/c instructions as indicated by MD - patient able to verbalize understanding, all questions fully answered. Patient instructed to return to ED, call 911, or call MD for any changes in condition.  Patient escorted via [place], and D/C home via private auto.  Venita Sheffield, RN 05/21/2019 11:28 AM

## 2019-05-21 NOTE — Plan of Care (Signed)
  Problem: Education: Goal: Knowledge of General Education information will improve Description: Including pain rating scale, medication(s)/side effects and non-pharmacologic comfort measures Outcome: Adequate for Discharge   Problem: Health Behavior/Discharge Planning: Goal: Ability to manage health-related needs will improve Outcome: Adequate for Discharge   Problem: Clinical Measurements: Goal: Ability to maintain clinical measurements within normal limits will improve Outcome: Adequate for Discharge Goal: Will remain free from infection Outcome: Adequate for Discharge Goal: Diagnostic test results will improve Outcome: Adequate for Discharge Goal: Respiratory complications will improve Outcome: Adequate for Discharge Goal: Cardiovascular complication will be avoided Outcome: Adequate for Discharge   Problem: Activity: Goal: Risk for activity intolerance will decrease Outcome: Adequate for Discharge   Problem: Nutrition: Goal: Adequate nutrition will be maintained Outcome: Adequate for Discharge   Problem: Coping: Goal: Level of anxiety will decrease Outcome: Adequate for Discharge   Problem: Elimination: Goal: Will not experience complications related to bowel motility Outcome: Adequate for Discharge Goal: Will not experience complications related to urinary retention Outcome: Adequate for Discharge   Problem: Pain Managment: Goal: General experience of comfort will improve Outcome: Adequate for Discharge   Problem: Safety: Goal: Ability to remain free from injury will improve Outcome: Adequate for Discharge   Problem: Skin Integrity: Goal: Risk for impaired skin integrity will decrease Outcome: Adequate for Discharge   Problem: Clinical Measurements: Goal: Postoperative complications will be avoided or minimized Outcome: Adequate for Discharge   Problem: Skin Integrity: Goal: Demonstration of wound healing without infection will improve Outcome: Adequate  for Discharge   

## 2019-05-22 ENCOUNTER — Telehealth: Payer: Self-pay | Admitting: Family Medicine

## 2019-05-22 NOTE — Telephone Encounter (Signed)
televisit made for 10/29

## 2019-05-22 NOTE — Telephone Encounter (Signed)
lmtcb

## 2019-05-22 NOTE — Telephone Encounter (Signed)
Yes I did see that and we wanted the MRI, we tried to call him and I guess he was still at the hospital and had not been discharged, I cannot order inpatient orders but once he is outpatient I would like him to come in for a visit as soon as possible, put him on one of my call days so that we can get the MRI ordered.

## 2019-05-23 ENCOUNTER — Ambulatory Visit (INDEPENDENT_AMBULATORY_CARE_PROVIDER_SITE_OTHER): Payer: BC Managed Care – PPO | Admitting: Family Medicine

## 2019-05-23 ENCOUNTER — Encounter: Payer: Self-pay | Admitting: Family Medicine

## 2019-05-23 DIAGNOSIS — D3502 Benign neoplasm of left adrenal gland: Secondary | ICD-10-CM

## 2019-05-23 DIAGNOSIS — D1803 Hemangioma of intra-abdominal structures: Secondary | ICD-10-CM

## 2019-05-23 DIAGNOSIS — Z9049 Acquired absence of other specified parts of digestive tract: Secondary | ICD-10-CM

## 2019-05-23 DIAGNOSIS — K769 Liver disease, unspecified: Secondary | ICD-10-CM

## 2019-05-23 NOTE — Progress Notes (Signed)
Virtual Visit via telephone Note  I connected with Patrick Ray on 05/23/19 at 1440 by telephone and verified that I am speaking with the correct person using two identifiers. Patrick Ray is currently located at home and no other people are currently with her during visit. The provider, Fransisca Kaufmann Hollyanne Schloesser, MD is located in their office at time of visit.  Call ended at 1452  I discussed the limitations, risks, security and privacy concerns of performing an evaluation and management service by telephone and the availability of in person appointments. I also discussed with the patient that there may be a patient responsible charge related to this service. The patient expressed understanding and agreed to proceed.   History and Present Illness: Patient is calling in for hospital f/u after cholecystectomy.  He was at [facility] and during CT was found to have an adrenal adenoma and spot on the liver that recommended MRI of those lesions. He is still hurting but it is getting much better.  He was admitted on 05/19/2019 and discharged on 05/22/2019  No diagnosis found.  Outpatient Encounter Medications as of 05/23/2019  Medication Sig  . carbamazepine (TEGRETOL XR) 400 MG 12 hr tablet Take 400 mg by mouth 3 (three) times daily after meals.    . docusate sodium (COLACE) 100 MG capsule Take 1 capsule (100 mg total) by mouth 2 (two) times daily.  Marland Kitchen ibuprofen (ADVIL) 200 MG tablet Take 600 mg by mouth every 6 (six) hours as needed for headache or moderate pain.  Marland Kitchen levETIRAcetam (KEPPRA) 1000 MG tablet Take 1,000 mg by mouth 2 (two) times daily.   . Melatonin 10 MG CAPS Take 1 capsule by mouth at bedtime.  . multivitamin (THERAGRAN) per tablet Take 1 tablet by mouth daily.    Marland Kitchen omeprazole (PRILOSEC) 20 MG capsule Take 1 capsule (20 mg total) by mouth daily.  . ondansetron (ZOFRAN-ODT) 4 MG disintegrating tablet Take 1 tablet (4 mg total) by mouth every 6 (six) hours as needed for nausea.   Marland Kitchen oxyCODONE (OXY IR/ROXICODONE) 5 MG immediate release tablet Take 1 tablet (5 mg total) by mouth every 4 (four) hours as needed for severe pain or breakthrough pain.  Marland Kitchen venlafaxine XR (EFFEXOR XR) 37.5 MG 24 hr capsule Take 1 capsule (37.5 mg total) by mouth daily with breakfast. (Patient taking differently: Take 37.5 mg by mouth every other day. )  . VIIBRYD 20 MG TABS Take 1 tablet by mouth daily.   No facility-administered encounter medications on file as of 05/23/2019.     Review of Systems  Constitutional: Negative for chills and fever.  Respiratory: Negative for shortness of breath and wheezing.   Cardiovascular: Negative for chest pain and leg swelling.  Gastrointestinal: Positive for abdominal pain. Negative for constipation, diarrhea, nausea and vomiting.  Musculoskeletal: Negative for back pain and gait problem.  Skin: Negative for rash.  Neurological: Negative for dizziness.  All other systems reviewed and are negative.   Observations/Objective: Patient sounds comfortable and in no acute distress  Assessment and Plan: Problem List Items Addressed This Visit    None    Visit Diagnoses    Adrenal adenoma, left    -  Primary   Relevant Orders   CBC with Differential/Platelet   CMP14+EGFR   Liver hemangioma       Relevant Orders   CBC with Differential/Platelet   CMP14+EGFR   Liver disease       Relevant Orders   MR ABDOMEN  W WO CONTRAST   CBC with Differential/Platelet   CMP14+EGFR   S/P cholecystectomy       Relevant Orders   CBC with Differential/Platelet   CMP14+EGFR       Follow Up Instructions:  Follow up as needed   I discussed the assessment and treatment plan with the patient. The patient was provided an opportunity to ask questions and all were answered. The patient agreed with the plan and demonstrated an understanding of the instructions.   The patient was advised to call back or seek an in-person evaluation if the symptoms worsen or if the  condition fails to improve as anticipated.  The above assessment and management plan was discussed with the patient. The patient verbalized understanding of and has agreed to the management plan. Patient is aware to call the clinic if symptoms persist or worsen. Patient is aware when to return to the clinic for a follow-up visit. Patient educated on when it is appropriate to go to the emergency department.    I provided 12 minutes of non-face-to-face time during this encounter.    Worthy Rancher, MD

## 2019-05-24 ENCOUNTER — Other Ambulatory Visit: Payer: BC Managed Care – PPO

## 2019-05-24 ENCOUNTER — Other Ambulatory Visit: Payer: Self-pay

## 2019-05-24 DIAGNOSIS — D1803 Hemangioma of intra-abdominal structures: Secondary | ICD-10-CM

## 2019-05-24 DIAGNOSIS — Z9049 Acquired absence of other specified parts of digestive tract: Secondary | ICD-10-CM | POA: Diagnosis not present

## 2019-05-24 DIAGNOSIS — K769 Liver disease, unspecified: Secondary | ICD-10-CM

## 2019-05-24 DIAGNOSIS — D3502 Benign neoplasm of left adrenal gland: Secondary | ICD-10-CM | POA: Diagnosis not present

## 2019-05-25 LAB — CBC WITH DIFFERENTIAL/PLATELET
Basophils Absolute: 0.1 10*3/uL (ref 0.0–0.2)
Basos: 1 %
EOS (ABSOLUTE): 0.1 10*3/uL (ref 0.0–0.4)
Eos: 2 %
Hematocrit: 43.4 % (ref 37.5–51.0)
Hemoglobin: 14.7 g/dL (ref 13.0–17.7)
Immature Grans (Abs): 0 10*3/uL (ref 0.0–0.1)
Immature Granulocytes: 0 %
Lymphocytes Absolute: 1.5 10*3/uL (ref 0.7–3.1)
Lymphs: 19 %
MCH: 31.3 pg (ref 26.6–33.0)
MCHC: 33.9 g/dL (ref 31.5–35.7)
MCV: 92 fL (ref 79–97)
Monocytes Absolute: 0.8 10*3/uL (ref 0.1–0.9)
Monocytes: 10 %
Neutrophils Absolute: 5.6 10*3/uL (ref 1.4–7.0)
Neutrophils: 68 %
Platelets: 292 10*3/uL (ref 150–450)
RBC: 4.7 x10E6/uL (ref 4.14–5.80)
RDW: 12.1 % (ref 11.6–15.4)
WBC: 8.1 10*3/uL (ref 3.4–10.8)

## 2019-05-25 LAB — CMP14+EGFR
ALT: 77 IU/L — ABNORMAL HIGH (ref 0–44)
AST: 45 IU/L — ABNORMAL HIGH (ref 0–40)
Albumin/Globulin Ratio: 1.5 (ref 1.2–2.2)
Albumin: 4.2 g/dL (ref 4.0–5.0)
Alkaline Phosphatase: 127 IU/L — ABNORMAL HIGH (ref 39–117)
BUN/Creatinine Ratio: 8 — ABNORMAL LOW (ref 9–20)
BUN: 7 mg/dL (ref 6–20)
Bilirubin Total: 0.3 mg/dL (ref 0.0–1.2)
CO2: 23 mmol/L (ref 20–29)
Calcium: 8.7 mg/dL (ref 8.7–10.2)
Chloride: 106 mmol/L (ref 96–106)
Creatinine, Ser: 0.87 mg/dL (ref 0.76–1.27)
GFR calc Af Amer: 128 mL/min/{1.73_m2} (ref >59)
GFR calc non Af Amer: 111 mL/min/{1.73_m2} (ref >59)
Globulin, Total: 2.8 g/dL (ref 1.5–4.5)
Glucose: 61 mg/dL — ABNORMAL LOW (ref 65–99)
Potassium: 3.8 mmol/L (ref 3.5–5.2)
Sodium: 143 mmol/L (ref 134–144)
Total Protein: 7 g/dL (ref 6.0–8.5)

## 2019-05-27 ENCOUNTER — Encounter: Payer: Self-pay | Admitting: General Surgery

## 2019-05-27 MED ORDER — LORAZEPAM 1 MG PO TABS: 1.0000 mg | ORAL_TABLET | Freq: Once | ORAL | 0 refills | Status: AC

## 2019-05-28 ENCOUNTER — Encounter: Payer: Self-pay | Admitting: General Surgery

## 2019-05-29 ENCOUNTER — Encounter: Payer: Self-pay | Admitting: Family Medicine

## 2019-06-04 ENCOUNTER — Other Ambulatory Visit: Payer: Self-pay

## 2019-06-04 ENCOUNTER — Telehealth: Payer: Self-pay | Admitting: General Surgery

## 2019-06-04 NOTE — Progress Notes (Signed)
[facility]  I am calling the patient for post operative evaluation due to the current COVID 19 pandemic.  The patient had a laparoscopic cholecystectomy on 05/20/19. The patient reports that they are doing well. The are tolerating a diet, having good pain control, and having looser BMs but this is getting better.  Incisions healing up, glue peeling off. he patient has no concerns.   Will see the patient PRN.  He is getting an MRI for his incidental adrenal nodule and liver hemangioma.   Pathology: A. GALLBLADDER, CHOLECYSTECTOMY:  Acute and chronic cholecystitis.  Cholelithiasis.  Benign reactive lymph node.   Curlene Labrum, MD [facility] [address] [place], [place] [address] [phone] (office)

## 2019-06-05 ENCOUNTER — Other Ambulatory Visit: Payer: Self-pay

## 2019-06-05 ENCOUNTER — Ambulatory Visit (HOSPITAL_COMMUNITY)
Admission: RE | Admit: 2019-06-05 | Discharge: 2019-06-05 | Disposition: A | Discharge status: Home / Self Care | Payer: BC Managed Care – PPO | Source: Ambulatory Visit | Attending: Family Medicine | Admitting: Family Medicine

## 2019-06-05 DIAGNOSIS — D1803 Hemangioma of intra-abdominal structures: Secondary | ICD-10-CM | POA: Diagnosis not present

## 2019-06-05 DIAGNOSIS — K769 Liver disease, unspecified: Secondary | ICD-10-CM | POA: Diagnosis not present

## 2019-06-05 MED ORDER — GADOBUTROL 1 MMOL/ML IV SOLN
9.0000 mL | Freq: Once | INTRAVENOUS | Status: AC | PRN
  Administered 2019-06-05: 9 mL via INTRAVENOUS

## 2019-06-20 ENCOUNTER — Other Ambulatory Visit: Payer: Self-pay | Admitting: Family Medicine

## 2019-06-24 ENCOUNTER — Encounter: Payer: Self-pay | Admitting: Family Medicine

## 2019-06-24 ENCOUNTER — Other Ambulatory Visit: Payer: Self-pay | Admitting: *Deleted

## 2019-06-24 NOTE — Telephone Encounter (Signed)
Dettinger. NTBS LOV 06/25/18

## 2019-07-05 DIAGNOSIS — G40209 Localization-related (focal) (partial) symptomatic epilepsy and epileptic syndromes with complex partial seizures, not intractable, without status epilepticus: Secondary | ICD-10-CM | POA: Diagnosis not present

## 2019-07-05 DIAGNOSIS — F329 Major depressive disorder, single episode, unspecified: Secondary | ICD-10-CM | POA: Diagnosis not present

## 2019-07-11 ENCOUNTER — Encounter: Payer: Self-pay | Admitting: Family Medicine

## 2019-08-06 ENCOUNTER — Encounter: Payer: Self-pay | Admitting: Physician Assistant

## 2019-10-29 ENCOUNTER — Other Ambulatory Visit: Payer: Self-pay

## 2019-10-29 ENCOUNTER — Other Ambulatory Visit: Payer: BC Managed Care – PPO

## 2019-10-29 DIAGNOSIS — D3502 Benign neoplasm of left adrenal gland: Secondary | ICD-10-CM

## 2019-10-29 DIAGNOSIS — K769 Liver disease, unspecified: Secondary | ICD-10-CM

## 2019-10-29 LAB — CBC WITH DIFFERENTIAL/PLATELET
Basophils Absolute: 0.1 10*3/uL (ref 0.0–0.2)
Basos: 1 %
EOS (ABSOLUTE): 0.1 10*3/uL (ref 0.0–0.4)
Eos: 2 %
Hematocrit: 46.5 % (ref 37.5–51.0)
Hemoglobin: 16.1 g/dL (ref 13.0–17.7)
Immature Grans (Abs): 0 10*3/uL (ref 0.0–0.1)
Immature Granulocytes: 0 %
Lymphocytes Absolute: 2.8 10*3/uL (ref 0.7–3.1)
Lymphs: 43 %
MCH: 31.6 pg (ref 26.6–33.0)
MCHC: 34.6 g/dL (ref 31.5–35.7)
MCV: 91 fL (ref 79–97)
Monocytes Absolute: 0.5 10*3/uL (ref 0.1–0.9)
Monocytes: 7 %
Neutrophils Absolute: 3.1 10*3/uL (ref 1.4–7.0)
Neutrophils: 47 %
Platelets: 291 10*3/uL (ref 150–450)
RBC: 5.1 x10E6/uL (ref 4.14–5.80)
RDW: 12.4 % (ref 11.6–15.4)
WBC: 6.6 10*3/uL (ref 3.4–10.8)

## 2019-10-29 LAB — CMP14+EGFR
ALT: 21 IU/L (ref 0–44)
AST: 19 IU/L (ref 0–40)
Albumin/Globulin Ratio: 1.8 (ref 1.2–2.2)
Albumin: 4.2 g/dL (ref 4.0–5.0)
Alkaline Phosphatase: 117 IU/L (ref 39–117)
BUN/Creatinine Ratio: 9 (ref 9–20)
BUN: 8 mg/dL (ref 6–20)
Bilirubin Total: 0.2 mg/dL (ref 0.0–1.2)
CO2: 24 mmol/L (ref 20–29)
Calcium: 8.7 mg/dL (ref 8.7–10.2)
Chloride: 104 mmol/L (ref 96–106)
Creatinine, Ser: 0.91 mg/dL (ref 0.76–1.27)
GFR calc Af Amer: 124 mL/min/{1.73_m2} (ref >59)
GFR calc non Af Amer: 107 mL/min/{1.73_m2} (ref >59)
Globulin, Total: 2.3 g/dL (ref 1.5–4.5)
Glucose: 92 mg/dL (ref 65–99)
Potassium: 3.9 mmol/L (ref 3.5–5.2)
Sodium: 143 mmol/L (ref 134–144)
Total Protein: 6.5 g/dL (ref 6.0–8.5)

## 2019-10-29 LAB — LIPID PANEL
Chol/HDL Ratio: 5.2 ratio — ABNORMAL HIGH (ref 0.0–5.0)
Cholesterol, Total: 217 mg/dL — ABNORMAL HIGH (ref 100–199)
HDL: 42 mg/dL (ref >39)
LDL Chol Calc (NIH): 146 mg/dL — ABNORMAL HIGH (ref 0–99)
Triglycerides: 160 mg/dL — ABNORMAL HIGH (ref 0–149)
VLDL Cholesterol Cal: 29 mg/dL (ref 5–40)

## 2019-10-31 ENCOUNTER — Encounter: Payer: Self-pay | Admitting: Neurology

## 2019-10-31 ENCOUNTER — Encounter: Payer: Self-pay | Admitting: Family Medicine

## 2019-10-31 ENCOUNTER — Ambulatory Visit: Payer: BC Managed Care – PPO | Admitting: Family Medicine

## 2019-10-31 ENCOUNTER — Other Ambulatory Visit: Payer: Self-pay

## 2019-10-31 VITALS — BP 126/84 | HR 81 | Temp 99.3°F | Ht 70.0 in | Wt 180.0 lb

## 2019-10-31 DIAGNOSIS — G809 Cerebral palsy, unspecified: Secondary | ICD-10-CM | POA: Diagnosis not present

## 2019-10-31 DIAGNOSIS — G40909 Epilepsy, unspecified, not intractable, without status epilepticus: Secondary | ICD-10-CM | POA: Diagnosis not present

## 2019-10-31 DIAGNOSIS — E785 Hyperlipidemia, unspecified: Secondary | ICD-10-CM | POA: Diagnosis not present

## 2019-10-31 DIAGNOSIS — K769 Liver disease, unspecified: Secondary | ICD-10-CM | POA: Diagnosis not present

## 2019-10-31 NOTE — Progress Notes (Signed)
BP 126/84   Pulse 81   Temp 99.3 F (37.4 C)   Ht 5' 10" (1.778 m)   Wt 180 lb (81.6 kg)   SpO2 94%   BMI 25.83 kg/m    Subjective:   Patient ID: Patrick Ray, male    DOB: 05-29-1981, 39 y.o.   MRN: [id number]  HPI: Patrick Ray is a 39 y.o. male presenting on 10/31/2019 for Medical Management of Chronic Issues (Reck LFT's and cholesterol)   HPI Patient is coming in for recheck on liver disease and elevated liver function.  He had his blood work rechecked 2 days ago and it looks great.  Normal thyroid  Hyperlipidemia Patient is coming in for recheck of his hyperlipidemia. The patient is currently taking no medication currently. They deny any issues with myalgias or history of liver damage from it. They deny any focal numbness or weakness or chest pain.   Patient has cerebral palsy and migraine history and seizure disorder and wants a new neurologist.  Relevant past medical, surgical, family and social history reviewed and updated as indicated. Interim medical history since our last visit reviewed. Allergies and medications reviewed and updated.  Review of Systems  Constitutional: Negative for chills and fever.  Respiratory: Negative for shortness of breath and wheezing.   Cardiovascular: Negative for chest pain and leg swelling.  Musculoskeletal: Negative for back pain and gait problem.  Skin: Negative for rash.  Psychiatric/Behavioral: Negative for dysphoric mood, self-injury, sleep disturbance and suicidal ideas. The patient is not nervous/anxious.   All other systems reviewed and are negative.   Per HPI unless specifically indicated above   Allergies as of 10/31/2019      Reactions   Amoxicillin Rash   Penicillins Rash   .Did it involve swelling of the face/tongue/throat, SOB, or low BP? unknown Did it involve sudden or severe rash/hives, skin peeling, or any reaction on the inside of your mouth or nose? yesDid you need to seek medical attention at a hospital or  doctor's office? no When did it last happen?childhood If all above answers are "NO", may proceed with cephalosporin use.      Medication List       Accurate as of October 31, 2019  1:45 PM. If you have any questions, ask your nurse or doctor.        STOP taking these medications   docusate sodium 100 MG capsule Commonly known as: COLACE Stopped by: Fransisca Kaufmann Inocencio Roy, MD   omeprazole 20 MG capsule Commonly known as: PRILOSEC Stopped by: Fransisca Kaufmann Diquan Kassis, MD   ondansetron 4 MG disintegrating tablet Commonly known as: ZOFRAN-ODT Stopped by: Fransisca Kaufmann Ayianna Darnold, MD   oxyCODONE 5 MG immediate release tablet Commonly known as: Oxy IR/ROXICODONE Stopped by: Fransisca Kaufmann Ruth Kovich, MD   Viibryd 20 MG Tabs Generic drug: Vilazodone HCl Stopped by: Fransisca Kaufmann Tiernan Millikin, MD     TAKE these medications   carbamazepine 400 MG 12 hr tablet Commonly known as: TEGRETOL XR Take 400 mg by mouth 3 (three) times daily after meals.   ibuprofen 200 MG tablet Commonly known as: ADVIL Take 600 mg by mouth every 6 (six) hours as needed for headache or moderate pain.   levETIRAcetam 1000 MG tablet Commonly known as: KEPPRA Take 1,000 mg by mouth 2 (two) times daily.   Melatonin 10 MG Caps Take 1 capsule by mouth at bedtime.   multivitamin per tablet Take 1 tablet by mouth daily.   Venlafaxine HCl 37.5 MG  Tb24 Take 37.5 mg by mouth in the morning and at bedtime.        Objective:   BP 126/84   Pulse 81   Temp 99.3 F (37.4 C)   Ht 5' 10" (1.778 m)   Wt 180 lb (81.6 kg)   SpO2 94%   BMI 25.83 kg/m   Wt Readings from Last 3 Encounters:  10/31/19 180 lb (81.6 kg)  05/19/19 194 lb 14.2 oz (88.4 kg)  08/30/18 202 lb (91.6 kg)    Physical Exam Vitals and nursing note reviewed.  Constitutional:      General: He is not in acute distress.    Appearance: He is well-developed. He is not diaphoretic.  Eyes:     General: No scleral icterus.    Conjunctiva/sclera: Conjunctivae  normal.  Neck:     Thyroid: No thyromegaly.  Cardiovascular:     Rate and Rhythm: Normal rate and regular rhythm.     Heart sounds: Normal heart sounds. No murmur.  Pulmonary:     Effort: Pulmonary effort is normal. No respiratory distress.     Breath sounds: Normal breath sounds. No wheezing.  Musculoskeletal:        General: Normal range of motion.     Cervical back: Neck supple.  Lymphadenopathy:     Cervical: No cervical adenopathy.  Skin:    General: Skin is warm and dry.     Findings: No rash.  Neurological:     Mental Status: He is alert and oriented to person, place, and time.     Coordination: Coordination normal.  Psychiatric:        Behavior: Behavior normal.     Results for orders placed or performed in visit on 10/29/19  Lipid panel  Result Value Ref Range   Cholesterol, Total 217 (H) 100 - 199 mg/dL   Triglycerides 160 (H) 0 - 149 mg/dL   HDL 42 >39 mg/dL   VLDL Cholesterol Cal 29 5 - 40 mg/dL   LDL Chol Calc (NIH) 146 (H) 0 - 99 mg/dL   Chol/HDL Ratio 5.2 (H) 0.0 - 5.0 ratio  CMP14+EGFR  Result Value Ref Range   Glucose 92 65 - 99 mg/dL   BUN 8 6 - 20 mg/dL   Creatinine, Ser 0.91 0.76 - 1.27 mg/dL   GFR calc non Af Amer 107 >59 mL/min/1.73   GFR calc Af Amer 124 >59 mL/min/1.73   BUN/Creatinine Ratio 9 9 - 20   Sodium 143 134 - 144 mmol/L   Potassium 3.9 3.5 - 5.2 mmol/L   Chloride 104 96 - 106 mmol/L   CO2 24 20 - 29 mmol/L   Calcium 8.7 8.7 - 10.2 mg/dL   Total Protein 6.5 6.0 - 8.5 g/dL   Albumin 4.2 4.0 - 5.0 g/dL   Globulin, Total 2.3 1.5 - 4.5 g/dL   Albumin/Globulin Ratio 1.8 1.2 - 2.2   Bilirubin Total 0.2 0.0 - 1.2 mg/dL   Alkaline Phosphatase 117 39 - 117 IU/L   AST 19 0 - 40 IU/L   ALT 21 0 - 44 IU/L  CBC with Differential/Platelet  Result Value Ref Range   WBC 6.6 3.4 - 10.8 x10E3/uL   RBC 5.10 4.14 - 5.80 x10E6/uL   Hemoglobin 16.1 13.0 - 17.7 g/dL   Hematocrit 46.5 37.5 - 51.0 %   MCV 91 79 - 97 fL   MCH 31.6 26.6 - 33.0 pg    MCHC 34.6 31.5 - 35.7 g/dL   RDW 12.4  11.6 - 15.4 %   Platelets 291 150 - 450 x10E3/uL   Neutrophils 47 Not Estab. %   Lymphs 43 Not Estab. %   Monocytes 7 Not Estab. %   Eos 2 Not Estab. %   Basos 1 Not Estab. %   Neutrophils Absolute 3.1 1.4 - 7.0 x10E3/uL   Lymphocytes Absolute 2.8 0.7 - 3.1 x10E3/uL   Monocytes Absolute 0.5 0.1 - 0.9 x10E3/uL   EOS (ABSOLUTE) 0.1 0.0 - 0.4 x10E3/uL   Basophils Absolute 0.1 0.0 - 0.2 x10E3/uL   Immature Granulocytes 0 Not Estab. %   Immature Grans (Abs) 0.0 0.0 - 0.1 x10E3/uL    Assessment & Plan:   Problem List Items Addressed This Visit      Nervous and Auditory   Cerebral palsy ([place])   Relevant Orders   Ambulatory referral to Neurology     Other   Hyperlipidemia    Other Visit Diagnoses    Seizure disorder [facility])    -  Primary   Relevant Orders   Ambulatory referral to Neurology   Liver disease          Liver function is back to normal, this looks good, cholesterol still borderline elevated, focus on diet and exercise and because of liver function being back to normal we will not do any medicine at this point.  We will do referral to neurology for chronic management for second opinion. Follow up plan: Return in about 6 months (around 05/01/2020), or if symptoms worsen or fail to improve, for Recheck seizure disorder.  Counseling provided for all of the vaccine components No orders of the defined types were placed in this encounter.   Caryl Pina, MD [facility] 10/31/2019, 1:45 PM

## 2020-02-05 ENCOUNTER — Encounter: Payer: Self-pay | Admitting: Neurology

## 2020-02-05 ENCOUNTER — Other Ambulatory Visit: Payer: Self-pay

## 2020-02-05 ENCOUNTER — Ambulatory Visit: Payer: BC Managed Care – PPO | Admitting: Neurology

## 2020-02-05 VITALS — BP 139/67 | HR 90 | Resp 18 | Ht 70.0 in | Wt 179.0 lb

## 2020-02-05 DIAGNOSIS — G8110 Spastic hemiplegia affecting unspecified side: Secondary | ICD-10-CM

## 2020-02-05 DIAGNOSIS — G40209 Localization-related (focal) (partial) symptomatic epilepsy and epileptic syndromes with complex partial seizures, not intractable, without status epilepticus: Secondary | ICD-10-CM

## 2020-02-05 MED ORDER — LEVETIRACETAM 1000 MG PO TABS: 1000.0000 mg | ORAL_TABLET | Freq: Two times a day (BID) | ORAL | 3 refills | Status: DC

## 2020-02-05 MED ORDER — CARBAMAZEPINE ER 400 MG PO TB12: 400.0000 mg | ORAL_TABLET | Freq: Three times a day (TID) | ORAL | 3 refills | Status: DC
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
## 2020-02-05 NOTE — Patient Instructions (Signed)
Great to meet you. Continue all your medications. Recommend discussing concerns with your PCP and urologist, and if those check out okay would consider seeing a psychiatrist to discuss the venlafaxine. Follow-up in 1 year, call for any changes.  Seizure Precautions: 1. If medication has been prescribed for you to prevent seizures, take it exactly as directed.  Do not stop taking the medicine without talking to your doctor first, even if you have not had a seizure in a long time.   2. Avoid activities in which a seizure would cause danger to yourself or to others.  Don't operate dangerous machinery, swim alone, or climb in high or dangerous places, such as on ladders, roofs, or girders.  Do not drive unless your doctor says you may.  3. If you have any warning that you may have a seizure, lay down in a safe place where you can't hurt yourself.    4.  No driving for 6 months from last seizure, as per [facility].   Please refer to the following link on the [place] website for more information: http://www.epilepsyfoundation.org/answerplace/Social/driving/drivingu.cfm   5.  Maintain good sleep hygiene. Avoid alcohol.  6.  Contact your doctor if you have any problems that may be related to the medicine you are taking.  7.  Call 911 and bring the patient back to the ED if:        A.  The seizure lasts longer than 5 minutes.       B.  The patient doesn't awaken shortly after the seizure  C.  The patient has new problems such as difficulty seeing, speaking or moving  D.  The patient was injured during the seizure  E.  The patient has a temperature over 102 F (39C)  F.  The patient vomited and now is having trouble breathing

## 2020-03-31 MED ORDER — VENLAFAXINE HCL ER 37.5 MG PO CP24: ORAL_CAPSULE | ORAL | 3 refills | Status: DC

## 2020-03-31 NOTE — Addendum Note (Signed)
Addended by: Cameron Sprang on: 03/31/2020 02:52 PM   Modules accepted: Orders

## 2020-03-31 NOTE — Addendum Note (Signed)
Addended by: Cameron Sprang on: 03/31/2020 09:31 AM   Modules accepted: Orders

## 2020-05-01 ENCOUNTER — Ambulatory Visit: Payer: BC Managed Care – PPO | Admitting: Family Medicine

## 2020-06-14 ENCOUNTER — Encounter: Payer: Self-pay | Admitting: Family Medicine

## 2020-06-14 DIAGNOSIS — R0683 Snoring: Secondary | ICD-10-CM

## 2020-06-14 DIAGNOSIS — G2581 Restless legs syndrome: Secondary | ICD-10-CM

## 2020-06-16 ENCOUNTER — Other Ambulatory Visit: Payer: Self-pay

## 2020-06-16 DIAGNOSIS — G2581 Restless legs syndrome: Secondary | ICD-10-CM

## 2020-06-16 DIAGNOSIS — R0683 Snoring: Secondary | ICD-10-CM

## 2020-06-20 DIAGNOSIS — J0140 Acute pansinusitis, unspecified: Secondary | ICD-10-CM | POA: Diagnosis not present

## 2020-07-01 ENCOUNTER — Other Ambulatory Visit: Payer: Self-pay

## 2020-07-01 ENCOUNTER — Encounter: Payer: Self-pay | Admitting: Family Medicine

## 2020-07-01 ENCOUNTER — Ambulatory Visit: Payer: BC Managed Care – PPO | Admitting: Family Medicine

## 2020-07-01 VITALS — BP 133/79 | HR 86 | Ht 70.0 in | Wt 187.0 lb

## 2020-07-01 DIAGNOSIS — R400 Somnolence: Secondary | ICD-10-CM | POA: Diagnosis not present

## 2020-07-01 DIAGNOSIS — R0683 Snoring: Secondary | ICD-10-CM | POA: Diagnosis not present

## 2020-07-01 NOTE — Progress Notes (Signed)
BP 133/79   Pulse 86   Ht 5\' 10"  (1.778 m)   Wt 187 lb (84.8 kg)   SpO2 91%   BMI 26.83 kg/m    Subjective:   Patient ID: Patrick Ray, male    DOB: [birth year], 39 y.o.   MRN: [id number]  HPI: Patrick Ray is a 39 y.o. male presenting on 07/01/2020 for Snoring (waking spouse up. Talking and lots of movement during the pm)   HPI Having a lot of snoring and movement talking at night and so loud that is waking up his spouse.  Is been going on for 5 or 6 months recently been together.  He says he does wake up with less energy or no energy during the day and she especially is having a lot of that as well because she is not able to sleep because of him.  Tried Breathe Right strips and propping himself up and they do not seem to be working as well.    Relevant past medical, surgical, family and social history reviewed and updated as indicated. Interim medical history since our last visit reviewed. Allergies and medications reviewed and updated.  Review of Systems  Constitutional: Negative for chills and fever.  Respiratory: Negative for shortness of breath and wheezing.   Cardiovascular: Negative for chest pain and leg swelling.  Musculoskeletal: Negative for back pain and gait problem.  Skin: Negative for rash.  Neurological: Negative for dizziness, weakness and light-headedness.  Psychiatric/Behavioral: Positive for sleep disturbance. Negative for dysphoric mood. The patient is not nervous/anxious.   All other systems reviewed and are negative.   Per HPI unless specifically indicated above   Allergies as of 07/01/2020      Reactions   Amoxicillin Rash   Penicillins Rash   .Did it involve swelling of the face/tongue/throat, SOB, or low BP? unknown Did it involve sudden or severe rash/hives, skin peeling, or any reaction on the inside of your mouth or nose? yesDid you need to seek medical attention at a hospital or doctor's office? no When did it last happen?childhood If  all above answers are "NO", may proceed with cephalosporin use.      Medication List       Accurate as of July 01, 2020  4:04 PM. If you have any questions, ask your nurse or doctor.        carbamazepine 400 MG 12 hr tablet Commonly known as: TEGRETOL XR Take 1 tablet (400 mg total) by mouth 3 (three) times daily after meals.   ibuprofen 200 MG tablet Commonly known as: ADVIL Take 600 mg by mouth every 6 (six) hours as needed for headache or moderate pain.   levETIRAcetam 1000 MG tablet Commonly known as: KEPPRA Take 1 tablet (1,000 mg total) by mouth 2 (two) times daily.   Melatonin 10 MG Caps Take 1 capsule by mouth at bedtime.   multivitamin per tablet Take 1 tablet by mouth daily.   venlafaxine XR 37.5 MG 24 hr capsule Commonly known as: Effexor XR Take 2 tablets every morning        Objective:   BP 133/79   Pulse 86   Ht 5\' 10"  (1.778 m)   Wt 187 lb (84.8 kg)   SpO2 91%   BMI 26.83 kg/m   Wt Readings from Last 3 Encounters:  07/01/20 187 lb (84.8 kg)  02/05/20 179 lb (81.2 kg)  10/31/19 180 lb (81.6 kg)    Physical Exam Vitals and nursing note reviewed.  Constitutional:      General: He is not in acute distress.    Appearance: He is not toxic-appearing.  HENT:     Mouth/Throat:     Mouth: Mucous membranes are moist.     Pharynx: No oropharyngeal exudate or posterior oropharyngeal erythema.  Neurological:     Mental Status: He is alert.       Assessment & Plan:   Problem List Items Addressed This Visit    None    Visit Diagnoses    Snoring    -  Primary   Relevant Orders   Ambulatory referral to Sleep Studies   Somnolence       Relevant Orders   Ambulatory referral to Sleep Studies       Follow up plan: Return if symptoms worsen or fail to improve.  Counseling provided for all of the vaccine components Orders Placed This Encounter  Procedures  . Ambulatory referral to Sleep Studies    Caryl Pina, MD [facility] 07/01/2020, 4:04 PM

## 2020-08-04 ENCOUNTER — Encounter: Payer: Self-pay | Admitting: Family Medicine

## 2020-10-12 ENCOUNTER — Other Ambulatory Visit: Payer: Self-pay

## 2020-10-12 ENCOUNTER — Telehealth: Payer: Self-pay | Admitting: Neurology

## 2020-10-12 ENCOUNTER — Other Ambulatory Visit: Payer: Self-pay | Admitting: Neurology

## 2020-10-12 MED ORDER — VENLAFAXINE HCL ER 37.5 MG PO CP24: ORAL_CAPSULE | ORAL | 0 refills | Status: DC

## 2020-10-12 MED ORDER — CARBAMAZEPINE ER 400 MG PO TB12: 400.0000 mg | ORAL_TABLET | Freq: Three times a day (TID) | ORAL | 1 refills | Status: DC

## 2020-10-12 MED ORDER — LEVETIRACETAM 1000 MG PO TABS: 1000.0000 mg | ORAL_TABLET | Freq: Two times a day (BID) | ORAL | 0 refills | Status: DC

## 2020-10-12 MED ORDER — CARBAMAZEPINE ER 400 MG PO TB12: 400.0000 mg | ORAL_TABLET | Freq: Three times a day (TID) | ORAL | 3 refills | Status: DC

## 2020-10-12 MED ORDER — LEVETIRACETAM 1000 MG PO TABS: 1000.0000 mg | ORAL_TABLET | Freq: Two times a day (BID) | ORAL | 3 refills | Status: DC

## 2020-10-12 MED ORDER — VENLAFAXINE HCL ER 37.5 MG PO CP24: ORAL_CAPSULE | ORAL | 3 refills | Status: DC

## 2020-10-12 MED ORDER — LEVETIRACETAM 1000 MG PO TABS: 1000.0000 mg | ORAL_TABLET | Freq: Two times a day (BID) | ORAL | 1 refills | Status: DC

## 2020-10-12 MED ORDER — CARBAMAZEPINE ER 400 MG PO TB12: 400.0000 mg | ORAL_TABLET | Freq: Three times a day (TID) | ORAL | 0 refills | Status: DC

## 2020-10-12 NOTE — Telephone Encounter (Signed)
Patient states that he is going to run out medication before his [facility]. He states that he needs enough to get him to 10-19-20 and 10-20-20.  He is on the Carbamazepine and Venafaxine  [facility] [place].  He also needs the Venlafaxine [facility] for a 90 day supply

## 2020-10-15 ENCOUNTER — Telehealth: Payer: Self-pay

## 2020-10-15 NOTE — Telephone Encounter (Signed)
I contacted pharmacy and carbamazepine had to be ordered, [facility]. So verbally given.left voicemail on patients phone to stop at pharmacy.

## 2020-11-20 IMAGING — US US ABDOMEN LIMITED
1 series · 14 of 25 positions shown · non-contrast
Comparison: None.

CLINICAL DATA: Right upper quadrant pain

EXAM:
ULTRASOUND ABDOMEN LIMITED RIGHT UPPER QUADRANT

[Series 1: us abdomen limited · 0.20mm/px · 14 of 100 slices shown]
[im 1/100]
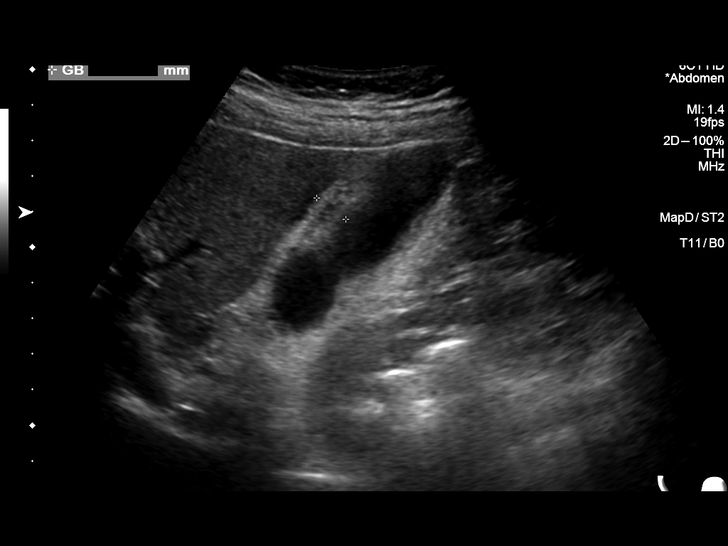
[im 9/100]
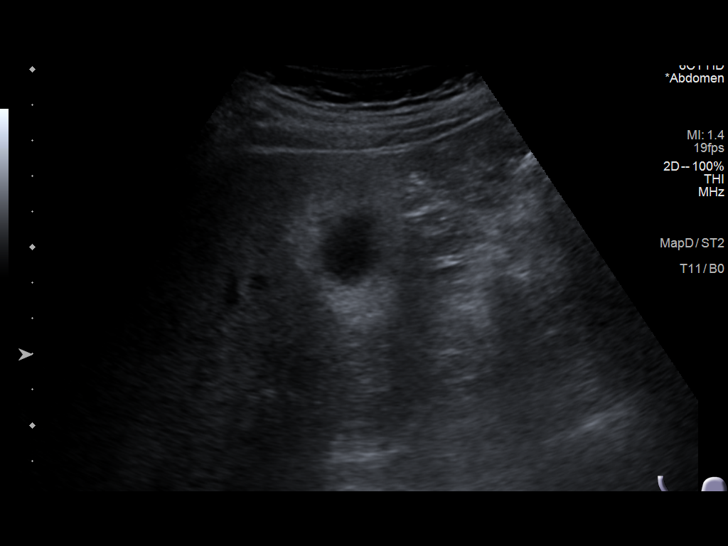
[im 17/100]
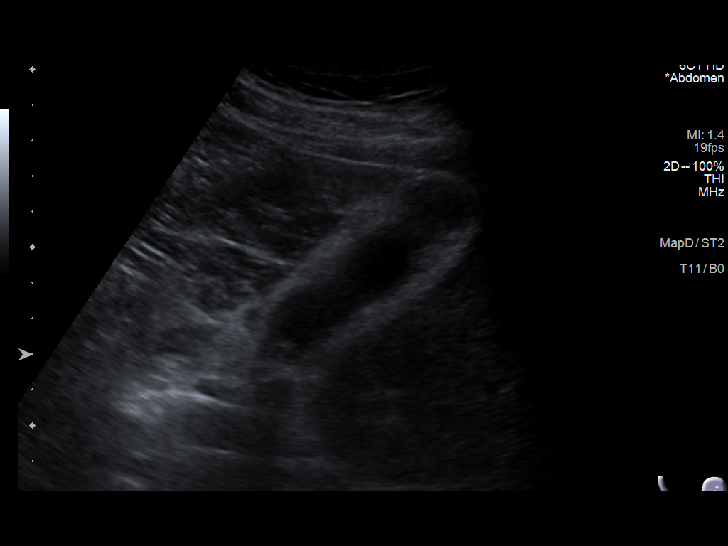
[im 25/100]
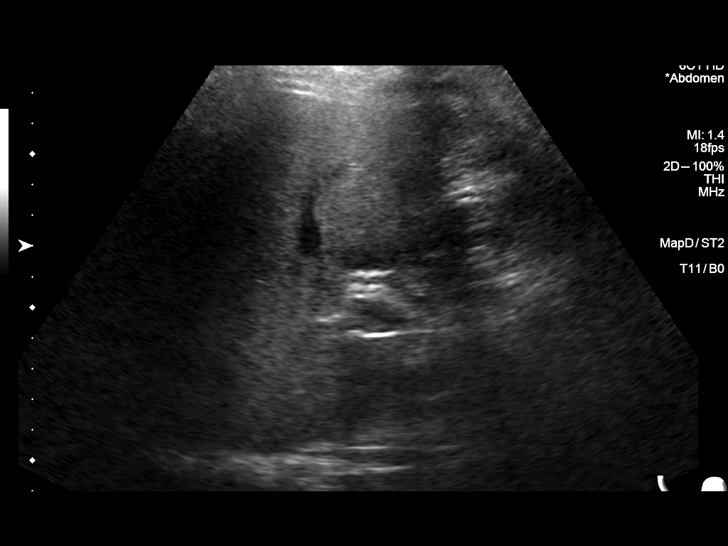
[im 34/100]
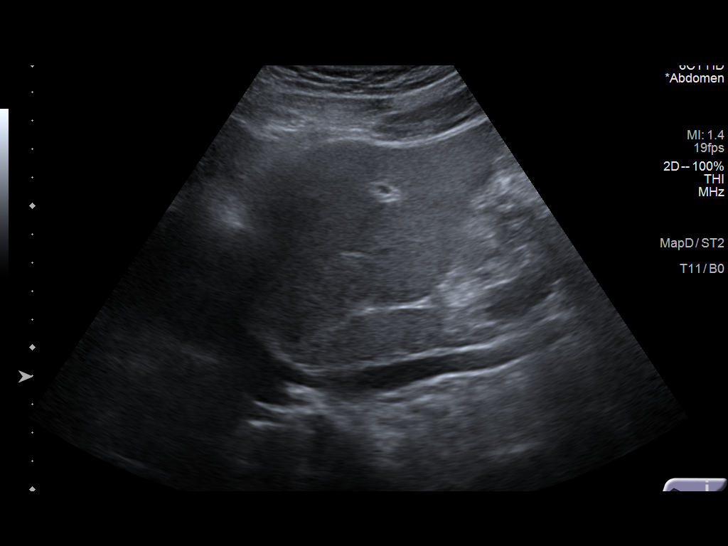
[im 38/100]
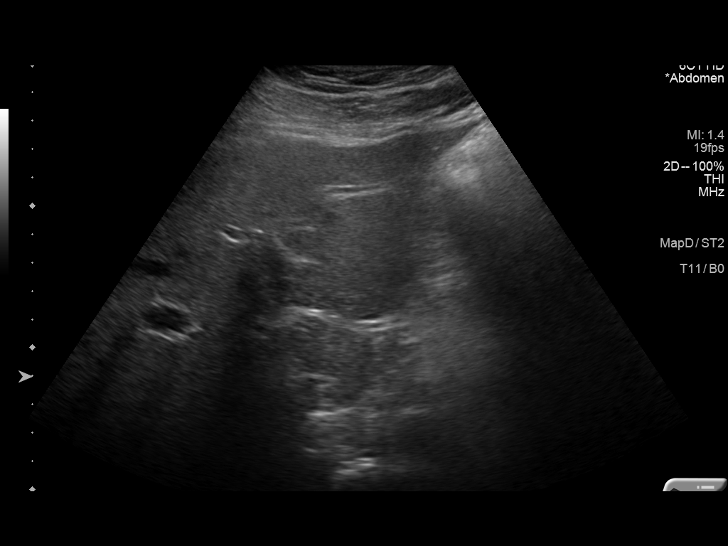
[im 46/100]
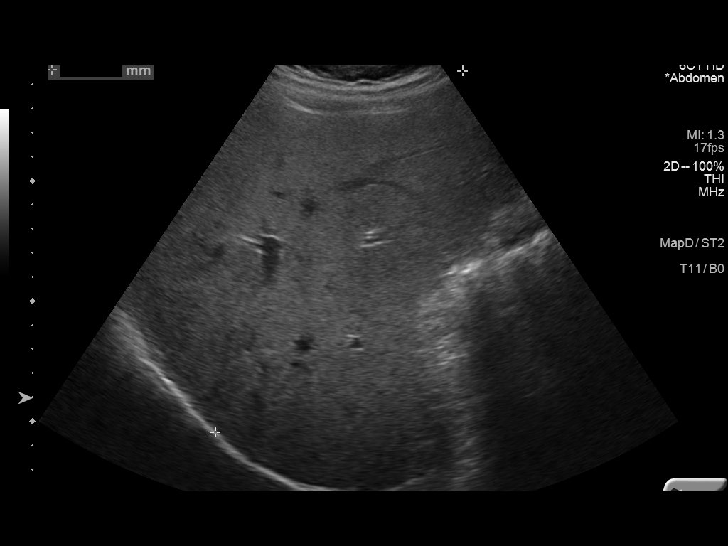
[im 54/100]
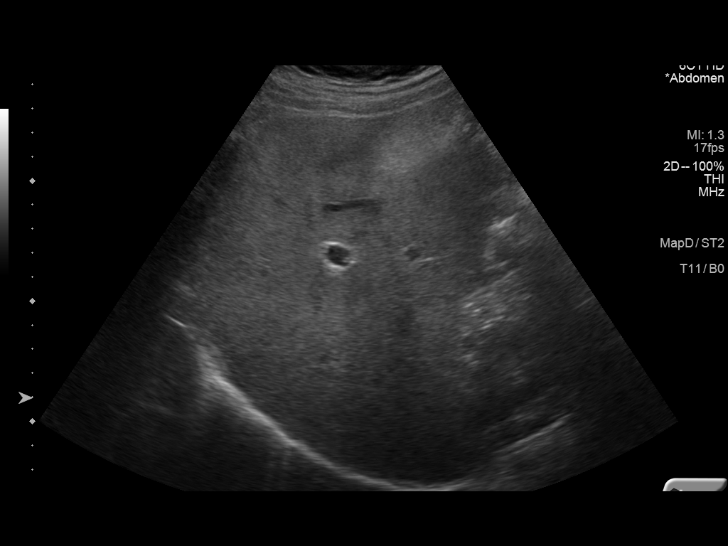
[im 62/100]
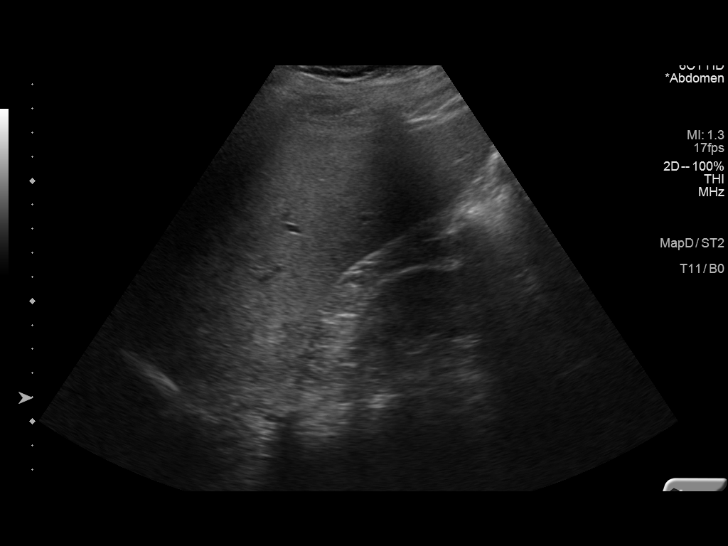
[im 67/100]
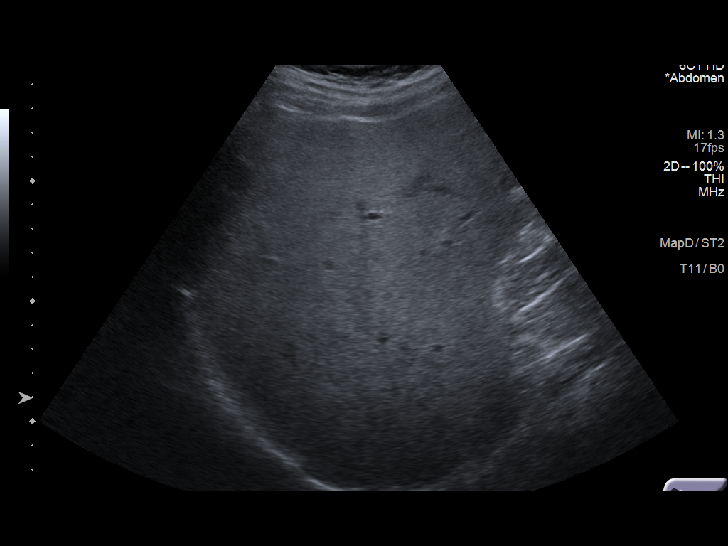
[im 75/100]
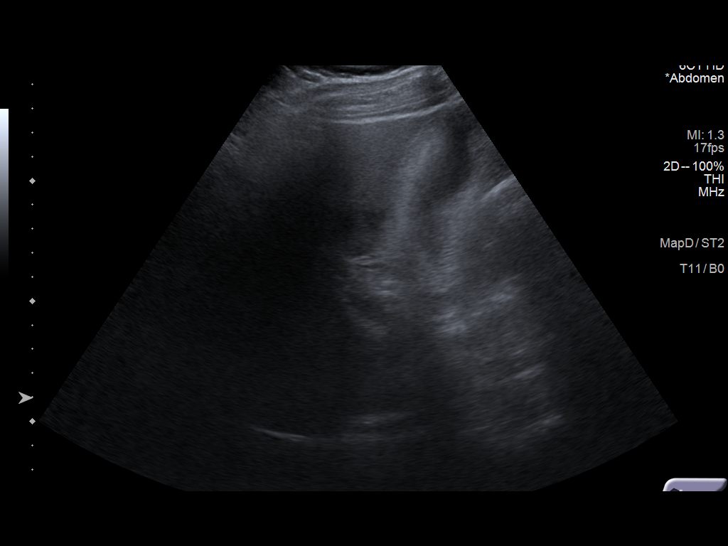
[im 83/100]
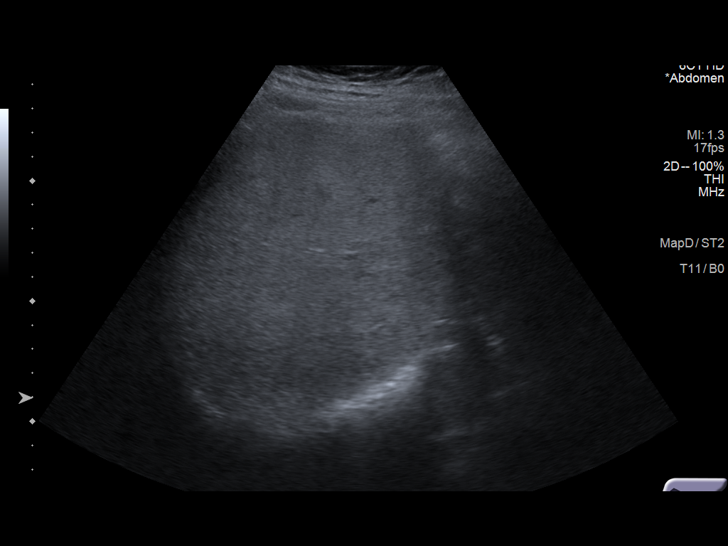
[im 91/100]
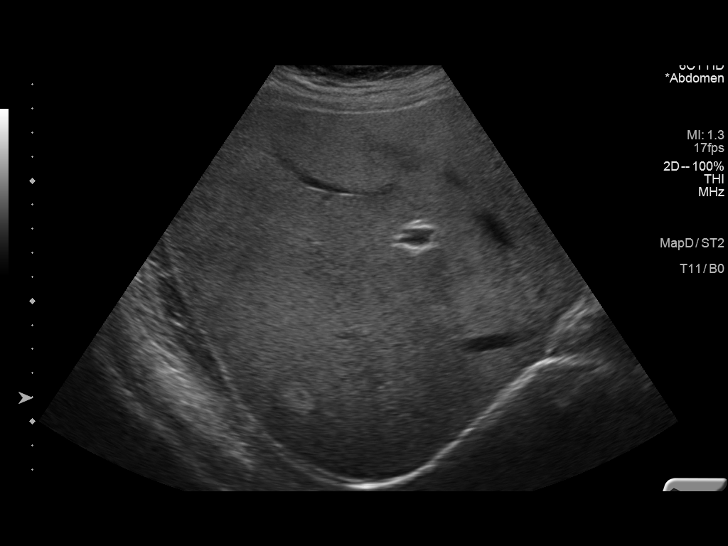
[im 100/100]
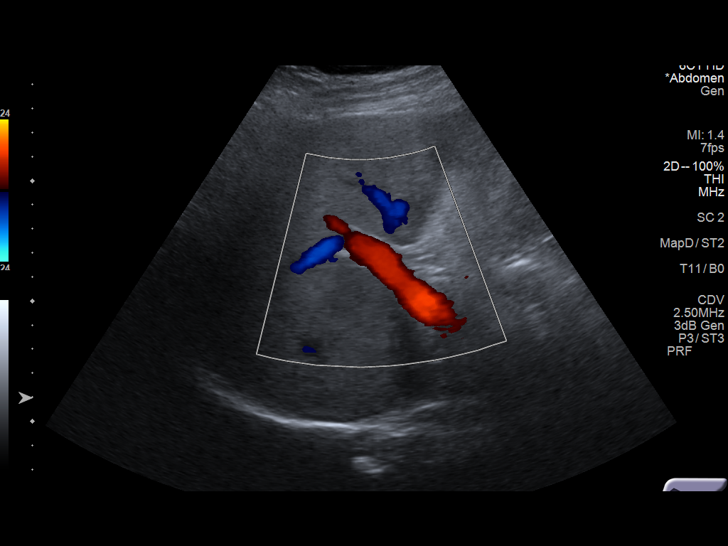

[14 of 25 positions shown; findings below may reference images not displayed]

FINDINGS: Gallbladder:

Cholelithiasis with severe gallbladder wall thickening measuring up
to 10 mm with a trace amount of pericholecystic fluid. Positive
sonographic Murphy sign. Largest calculus measures 13 mm.

Common bile duct:

Diameter: 3.5 mm

Liver:

Increased hepatic parenchymal echogenicity. Portal vein is patent on
color Doppler imaging with normal direction of blood flow towards
the liver. Increased hepatic size. 15 x 13 x 11 mm hyperechoic right
hepatic mass most consistent with a small hemangioma.

Other: None.
IMPRESSION: 1. Cholelithiasis with sonographic findings most concerning for
acute cholecystitis.
2. Hepatic steatosis.

## 2020-12-07 IMAGING — MR MR ABDOMEN WO/W CM
8 of 18 series · 17 of 48 positions shown · IV contrast (9 ml gadavist)
Comparison: None.

CLINICAL DATA: 15 x 13 x 11 mm hyperechoic right hepatic mass most
consistent with a small hemangioma seen on recent ultrasound. GB
removed 05/20/2019.^9mL GADAVIST GADOBUTROL 1 MMOL/ML IV SOLNLiver
lesion, >1cm, normal liver, no known malignancy

EXAM:
MRI ABDOMEN WITHOUT AND WITH CONTRAST
TECHNIQUE: Multiplanar multisequence MR imaging of the abdomen was performed
both before and after the administration of intravenous contrast.
CONTRAST:  9mL GADAVIST GADOBUTROL 1 MMOL/ML IV SOLN

[Series 4: T2 · coronal · 5.0mm · 1.23mm/px · 1 of 36 slices shown (1 of 3)]
[im 1/36]
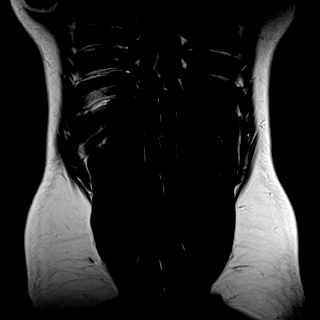

[Series 5: T2 · axial · 4.0mm · 1.25mm/px · z∈[-107,+138]mm · 2 of 52 slices shown (2 of 3)]
[im 1/52]
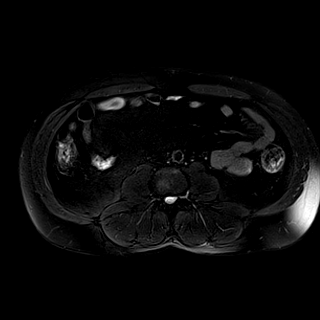
[im 52/52]
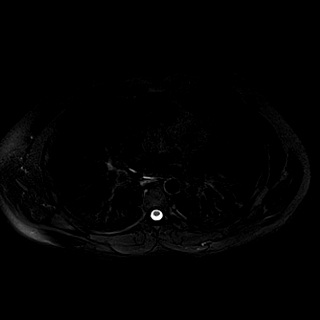

[Series 6: DWI · axial · 5.0mm · 1.04mm/px · z∈[-107,+139]mm · 3 of 84 slices shown]
[im 1/84]
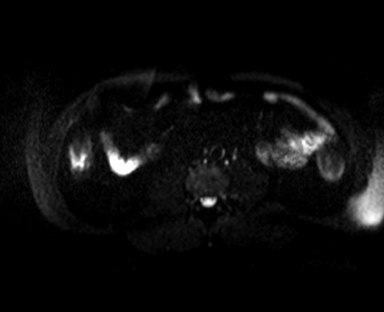
[im 42/84]
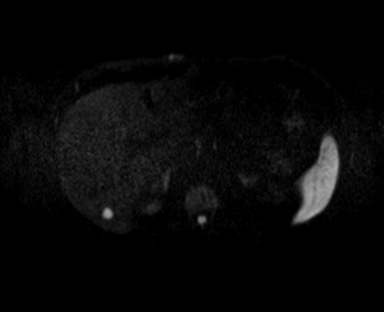
[im 84/84]
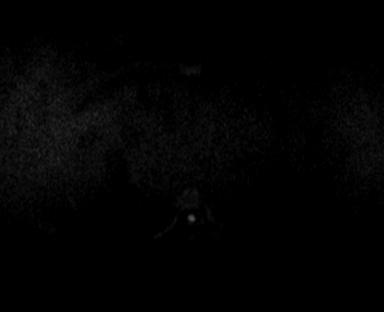

[Series 7: ax dwi_adc · axial · 5.0mm · 1.04mm/px · z∈[-107,+139]mm · 2 of 42 slices shown]
[im 1/42]
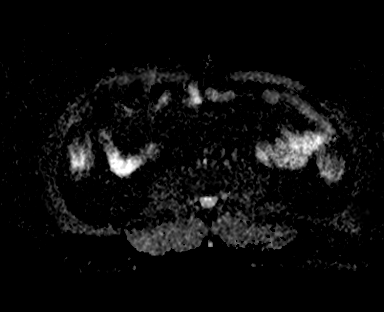
[im 42/42]
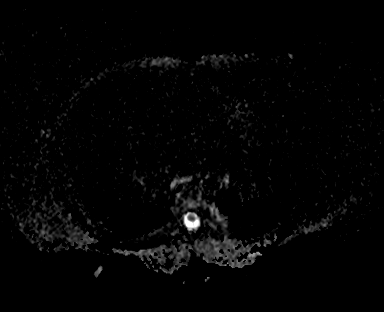

[Series 8: bSSFP · axial · 4.0mm · 0.78mm/px · z∈[-135,+141]mm · 3 of 70 slices shown]
[im 1/70]
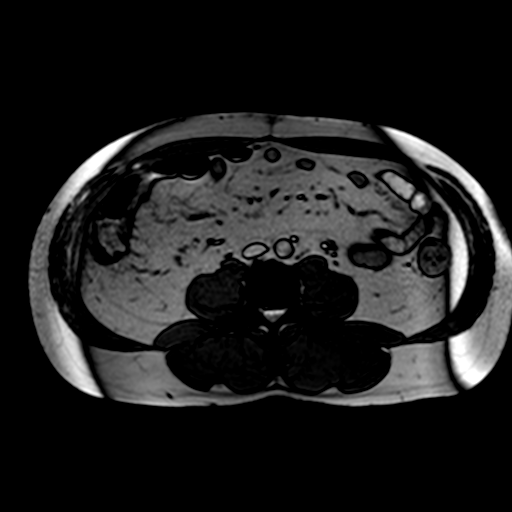
[im 35/70]
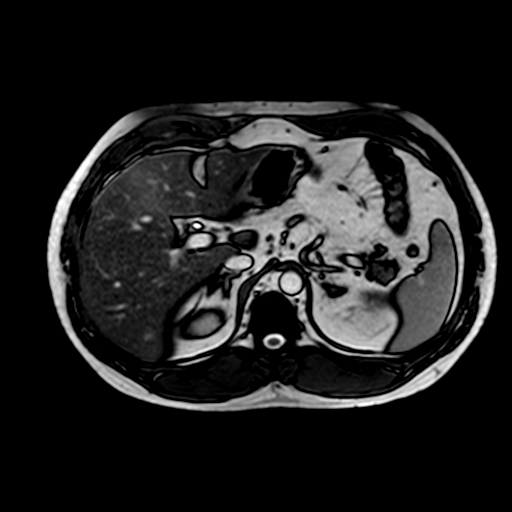
[im 70/70]
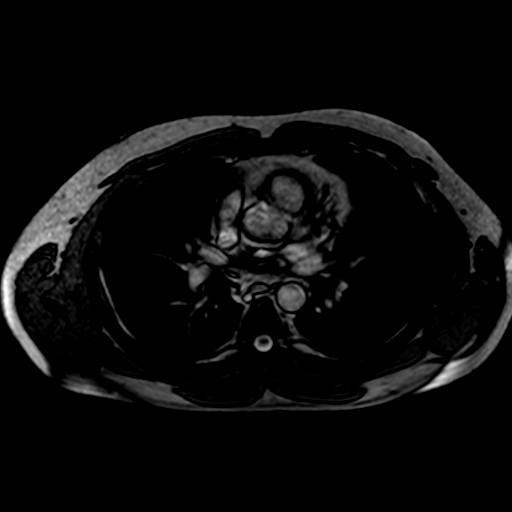

[Series 9: ax dual echo_in · axial · 4.0mm · 0.63mm/px · z∈[-142,+93]mm · 2 of 60 slices shown]
[im 1/60]
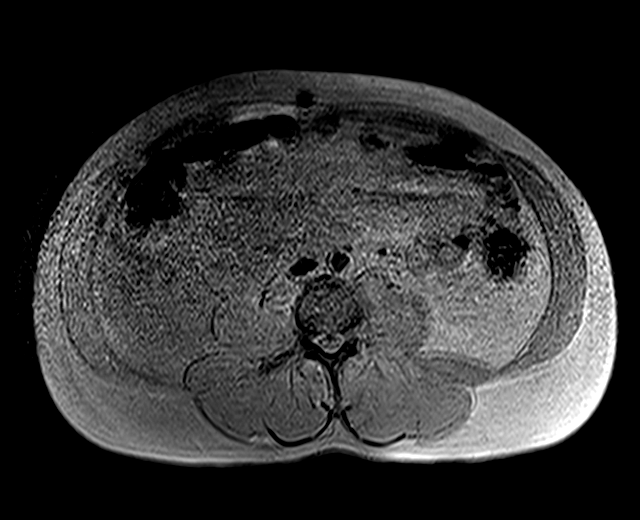
[im 60/60]
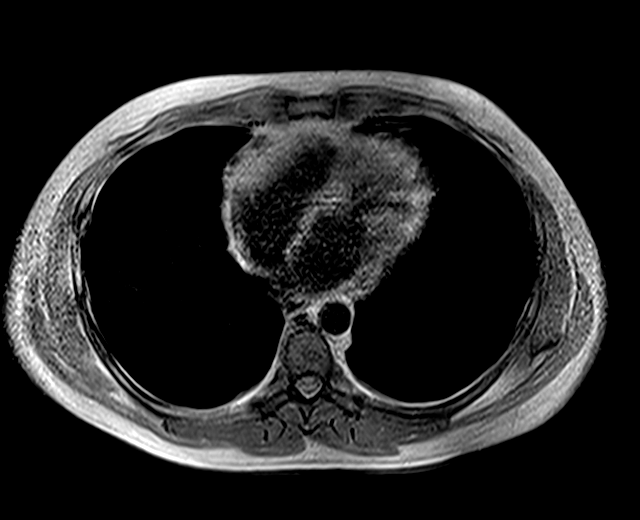

[Series 10: ax dual echo_opp · axial · 4.0mm · 0.63mm/px · z∈[-142,+93]mm · 2 of 60 slices shown]
[im 1/60]
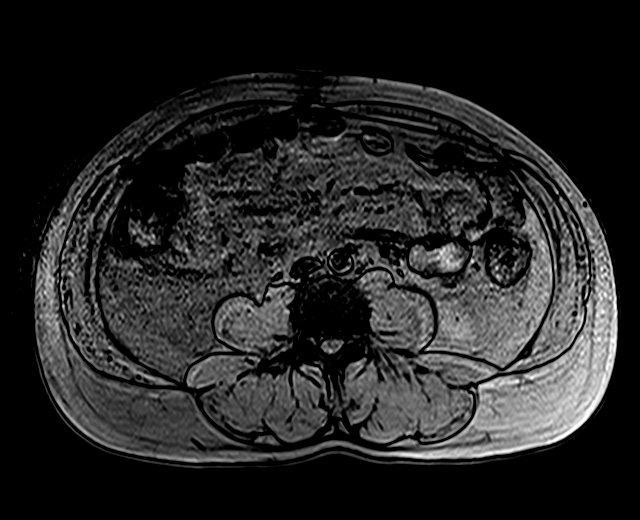
[im 60/60]
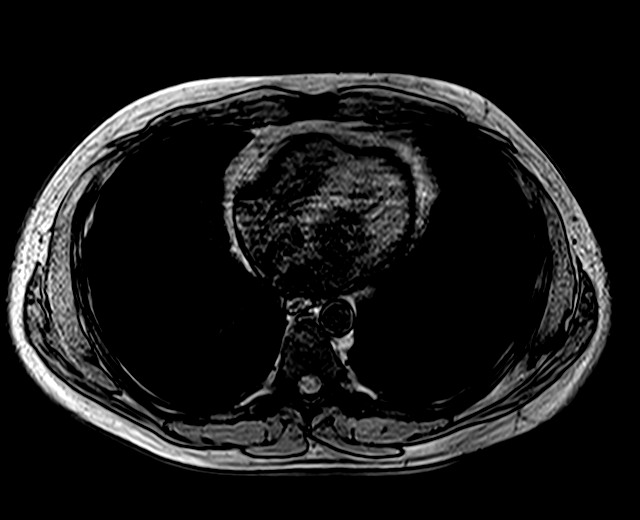

[Series 21: T2 · axial · 5.0mm · 1.58mm/px · z∈[-147,+98]mm · 2 of 42 slices shown (3 of 3)]
[im 1/42]
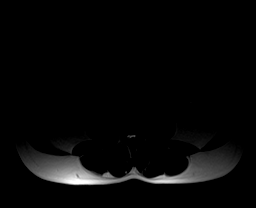
[im 42/42]
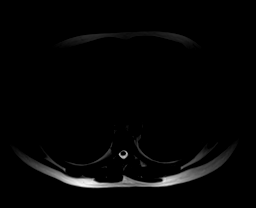

[17 of 48 positions shown; findings below may reference images not displayed]

FINDINGS: Lower chest:  Lung bases are clear.

Hepatobiliary: Mild inflammationin the gallbladder fossa at site of
cholecystectomy. There is a rounded collection with high signal
intensity on the noncontrast T1 weighted imaging (image 35/13)
consistent with protein or blood product. There is mild edema within
the adjacent liver parenchyma (on T2 weighted imaging). No
significant post-contrast enhancement. Favor non complicated
postsurgical collection.

Within the posterior RIGHT hepatic lobe small rounded lesion
measuring 1.2 cm (image [DATE]) corresponds to the indeterminate
lesion on comparison CT. This lesion is high signal intensity on T2
weighted imaging and demonstrates post-contrast enhancement
consistent with small

No additional hepatic lesions.  External bile ducts normal.

Pancreas: Normal pancreatic parenchymal intensity. No ductal
dilatation or inflammation.

Spleen: Normal spleen.

Adrenals/urinary tract: Adrenal glands and kidneys are normal.

Stomach/Bowel: Stomach and limited of the small bowel is
unremarkable

Vascular/Lymphatic: Abdominal aortic normal caliber. No
retroperitoneal periportal lymphadenopathy.

Musculoskeletal: No aggressive osseous lesion
IMPRESSION: 1. Benign hemangioma in the posterior RIGHT hepatic lobe corresponds
indeterminate lesion on comparison CT.
2. Postsurgical collection in the gallbladder fossa is favored non
complicated postsurgical collection. Recommend follow-up CT if
patient experiences fever or pain.

## 2021-02-05 ENCOUNTER — Ambulatory Visit: Payer: BC Managed Care – PPO | Admitting: Neurology

## 2021-03-01 ENCOUNTER — Ambulatory Visit: Payer: BC Managed Care – PPO | Admitting: Neurology

## 2021-04-20 ENCOUNTER — Telehealth: Payer: BC Managed Care – PPO | Admitting: Physician Assistant

## 2021-04-20 DIAGNOSIS — B9689 Other specified bacterial agents as the cause of diseases classified elsewhere: Secondary | ICD-10-CM | POA: Diagnosis not present

## 2021-04-20 DIAGNOSIS — J019 Acute sinusitis, unspecified: Secondary | ICD-10-CM

## 2021-04-20 MED ORDER — DOXYCYCLINE HYCLATE 100 MG PO TABS: 100.0000 mg | ORAL_TABLET | Freq: Two times a day (BID) | ORAL | 0 refills | Status: DC

## 2021-04-20 NOTE — Progress Notes (Signed)
Virtual Visit Consent   Patrick Ray, you are scheduled for a virtual visit with a [facility] provider today.     Just as with appointments in the office, your consent must be obtained to participate.  Your consent will be active for this visit and any virtual visit you may have with one of our providers in the next 365 days.     If you have a MyChart account, a copy of this consent can be sent to you electronically.  All virtual visits are billed to your insurance company just like a traditional visit in the office.    As this is a virtual visit, video technology does not allow for your provider to perform a traditional examination.  This may limit your provider's ability to fully assess your condition.  If your provider identifies any concerns that need to be evaluated in person or the need to arrange testing (such as labs, EKG, etc.), we will make arrangements to do so.     Although advances in technology are sophisticated, we cannot ensure that it will always work on either your end or our end.  If the connection with a video visit is poor, the visit may have to be switched to a telephone visit.  With either a video or telephone visit, we are not always able to ensure that we have a secure connection.     I need to obtain your verbal consent now.   Are you willing to proceed with your visit today?    Patrick Ray has provided verbal consent on 04/20/2021 for a virtual visit (video or telephone).   Mar Daring, PA-C   Date: 04/20/2021 1:05 PM   Virtual Visit via Video Note   I, Mar Daring, connected with  Patrick Ray  ([id number], 05-Jul-1981) on 04/20/21 at  1:00 PM EDT by a video-enabled telemedicine application and verified that I am speaking with the correct person using two identifiers.  Location: Patient: Virtual Visit Location Patient: Home Provider: Virtual Visit Location Provider: Home Office   I discussed the limitations of evaluation and management by  telemedicine and the availability of in person appointments. The patient expressed understanding and agreed to proceed.    History of Present Illness: Patrick Ray is a 40 y.o. who identifies as a male who was assigned male at birth, and is being seen today for possible sinusitis.  HPI: Sinusitis This is a new problem. The current episode started in the past 7 days (sinus pressure and tension headaches present over last 2-3 weeks, then progressed this week with increased congestion and mucous production). The problem has been gradually worsening since onset. There has been no fever. The pain is moderate. Associated symptoms include chills, congestion, coughing, headaches, a hoarse voice and sinus pressure. (Post nasal drainage, rhinorrhea) Treatments tried: tylenol sinus and mucinex. The treatment provided no relief.   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 unknown Did it involve sudden or severe rash/hives, skin peeling, or any  reaction on the inside of your mouth or nose? yesDid you need to seek medical attention at a hospital or doctor's office? no When did it last happen?    childhood   If all above answers are "NO", may proceed with cephalosporin use.    Medications:  Current Outpatient Medications:    doxycycline (VIBRA-TABS) 100 MG tablet, Take 1 tablet (100 mg total) by mouth 2 (two) times daily., Disp: 20 tablet, Rfl: 0   carbamazepine (TEGRETOL XR) 400 MG 12 hr tablet, Take 1 tablet (400 mg total) by mouth 3 (three) times daily after  meals., Disp: 270 tablet, Rfl: 3   ibuprofen (ADVIL) 200 MG tablet, Take 600 mg by mouth every 6 (six) hours as needed for headache or moderate pain., Disp: , Rfl:    levETIRAcetam (KEPPRA) 1000 MG tablet, Take 1 tablet (1,000 mg total) by mouth 2 (two) times daily., Disp: 180 tablet, Rfl: 3   Melatonin 10 MG CAPS, Take 1 capsule by mouth at bedtime., Disp: , Rfl:    multivitamin (THERAGRAN) per tablet, Take 1 tablet by mouth daily.  , Disp: , Rfl:    venlafaxine XR (EFFEXOR XR) 37.5 MG 24 hr capsule, Take 2 tablets every morning, Disp: 180 capsule, Rfl: 3  Observations/Objective: Patient is well-developed, well-nourished in no acute distress.  Resting comfortably  at home.  Head is normocephalic, atraumatic.  No labored breathing.  Speech is clear and coherent with logical content.  Patient is alert and oriented at baseline.  Nasal voice heard  Assessment and Plan: 1. Acute bacterial sinusitis - doxycycline (VIBRA-TABS) 100 MG tablet; Take 1 tablet (100 mg total) by mouth 2 (two) times daily.  Dispense: 20 tablet; Refill: 0  - Worsening symptoms that have not responded to OTC medications.  - Will give Doxycycline  - Continue allergy medications.  - Stay well hydrated and get plenty of rest.  - Call or seek in person evaluation if no symptom improvement or if symptoms worsen.  Follow Up Instructions: I discussed the assessment and treatment plan with the patient. The patient was provided an opportunity to ask questions and all were answered. The patient agreed with the plan and demonstrated an understanding of the instructions.  A copy of instructions were sent to the patient via MyChart unless otherwise noted below.    The patient was advised to call back or seek an in-person evaluation if the symptoms worsen or if the condition fails to improve as anticipated.  Time:  I spent 8 minutes with the patient via telehealth technology discussing the above problems/concerns.    Mar Daring, PA-C

## 2021-04-20 NOTE — Patient Instructions (Signed)
Patrick Ray, thank you for joining Mar Daring, PA-C for today's virtual visit.  While this provider is not your primary care provider (PCP), if your PCP is located in our provider database this encounter information will be shared with them immediately following your visit.  Consent: (Patient) Patrick Ray provided verbal consent for this virtual visit at the beginning of the encounter.  Current Medications:  Current Outpatient Medications:    doxycycline (VIBRA-TABS) 100 MG tablet, Take 1 tablet (100 mg total) by mouth 2 (two) times daily., Disp: 20 tablet, Rfl: 0   carbamazepine (TEGRETOL XR) 400 MG 12 hr tablet, Take 1 tablet (400 mg total) by mouth 3 (three) times daily after meals., Disp: 270 tablet, Rfl: 3   ibuprofen (ADVIL) 200 MG tablet, Take 600 mg by mouth every 6 (six) hours as needed for headache or moderate pain., Disp: , Rfl:    levETIRAcetam (KEPPRA) 1000 MG tablet, Take 1 tablet (1,000 mg total) by mouth 2 (two) times daily., Disp: 180 tablet, Rfl: 3   Melatonin 10 MG CAPS, Take 1 capsule by mouth at bedtime., Disp: , Rfl:    multivitamin (THERAGRAN) per tablet, Take 1 tablet by mouth daily.  , Disp: , Rfl:    venlafaxine XR (EFFEXOR XR) 37.5 MG 24 hr capsule, Take 2 tablets every morning, Disp: 180 capsule, Rfl: 3   Medications ordered in this encounter:  Meds ordered this encounter  Medications   doxycycline (VIBRA-TABS) 100 MG tablet    Sig: Take 1 tablet (100 mg total) by mouth 2 (two) times daily.    Dispense:  20 tablet    Refill:  0    Order Specific Question:   Supervising Provider    Answer:   Sabra Heck, BRIAN [[id number]]     *If you need refills on other medications prior to your next appointment, please contact your pharmacy*  Follow-Up: Call back or seek an in-person evaluation if the symptoms worsen or if the condition fails to improve as anticipated.  Other Instructions Sinusitis, Adult Sinusitis is soreness and swelling (inflammation) of your  sinuses. Sinuses are hollow spaces in the bones around your face. They are located: Around your eyes. In the middle of your forehead. Behind your nose. In your cheekbones. Your sinuses and nasal passages are lined with a fluid called mucus. Mucus drains out of your sinuses. Swelling can trap mucus in your sinuses. This lets germs (bacteria, virus, or fungus) grow, which leads to infection. Most of the time, this condition is caused by a virus. What are the causes? This condition is caused by: Allergies. Asthma. Germs. Things that block your nose or sinuses. Growths in the nose (nasal polyps). Chemicals or irritants in the air. Fungus (rare). What increases the risk? You are more likely to develop this condition if: You have a weak body defense system (immune system). You do a lot of swimming or diving. You use nasal sprays too much. You smoke. What are the signs or symptoms? The main symptoms of this condition are pain and a feeling of pressure around the sinuses. Other symptoms include: Stuffy nose (congestion). Runny nose (drainage). Swelling and warmth in the sinuses. Headache. Toothache. A cough that may get worse at night. Mucus that collects in the throat or the back of the nose (postnasal drip). Being unable to smell and taste. Being very tired (fatigue). A fever. Sore throat. Bad breath. How is this diagnosed? This condition is diagnosed based on: Your symptoms. Your medical history. A  physical exam. Tests to find out if your condition is short-term (acute) or long-term (chronic). Your doctor may: Check your nose for growths (polyps). Check your sinuses using a tool that has a light (endoscope). Check for allergies or germs. Do imaging tests, such as an MRI or CT scan. How is this treated? Treatment for this condition depends on the cause and whether it is short-term or long-term. If caused by a virus, your symptoms should go away on their own within 10 days.  You may be given medicines to relieve symptoms. They include: Medicines that shrink swollen tissue in the nose. Medicines that treat allergies (antihistamines). A spray that treats swelling of the nostrils.  Rinses that help get rid of thick mucus in your nose (nasal saline washes). If caused by bacteria, your doctor may wait to see if you will get better without treatment. You may be given antibiotic medicine if you have: A very bad infection. A weak body defense system. If caused by growths in the nose, you may need to have surgery. Follow these instructions at home: Medicines Take, use, or apply over-the-counter and prescription medicines only as told by your doctor. These may include nasal sprays. If you were prescribed an antibiotic medicine, take it as told by your doctor. Do not stop taking the antibiotic even if you start to feel better. Hydrate and humidify  Drink enough water to keep your pee (urine) pale yellow. Use a cool mist humidifier to keep the humidity level in your home above 50%. Breathe in steam for 10-15 minutes, 3-4 times a day, or as told by your doctor. You can do this in the bathroom while a hot shower is running. Try not to spend time in cool or dry air. Rest Rest as much as you can. Sleep with your head raised (elevated). Make sure you get enough sleep each night. General instructions  Put a warm, moist washcloth on your face 3-4 times a day, or as often as told by your doctor. This will help with discomfort. Wash your hands often with soap and water. If there is no soap and water, use hand sanitizer. Do not smoke. Avoid being around people who are smoking (secondhand smoke). Keep all follow-up visits as told by your doctor. This is important. Contact a doctor if: You have a fever. Your symptoms get worse. Your symptoms do not get better within 10 days. Get help right away if: You have a very bad headache. You cannot stop throwing up (vomiting). You  have very bad pain or swelling around your face or eyes. You have trouble seeing. You feel confused. Your neck is stiff. You have trouble breathing. Summary Sinusitis is swelling of your sinuses. Sinuses are hollow spaces in the bones around your face. This condition is caused by tissues in your nose that become inflamed or swollen. This traps germs. These can lead to infection. If you were prescribed an antibiotic medicine, take it as told by your doctor. Do not stop taking it even if you start to feel better. Keep all follow-up visits as told by your doctor. This is important. This information is not intended to replace advice given to you by your health care provider. Make sure you discuss any questions you have with your health care provider. Document Revised: 12/11/2017 Document Reviewed: 12/11/2017 Elsevier Patient Education  2022 [employer].    If you have been instructed to have an in-person evaluation today at a local Urgent Care facility, please use the link  below. It will take you to a list of all of our available [facility], including address, phone number and hours of operation. Please do not delay care.  [facility]  If you or a family member do not have a primary care provider, use the link below to schedule a visit and establish care. When you choose a [facility] primary care physician or advanced practice provider, you gain a long-term partner in health. Find a Primary Care Provider  Learn more about [facility]'s in-office and virtual care options: [facility]

## 2021-09-24 ENCOUNTER — Ambulatory Visit: Payer: BC Managed Care – PPO | Admitting: Neurology

## 2021-11-09 ENCOUNTER — Other Ambulatory Visit: Payer: Self-pay | Admitting: Neurology

## 2021-11-21 ENCOUNTER — Encounter: Payer: Self-pay | Admitting: Neurology

## 2021-11-22 ENCOUNTER — Other Ambulatory Visit: Payer: Self-pay

## 2021-11-22 MED ORDER — VENLAFAXINE HCL ER 37.5 MG PO CP24: ORAL_CAPSULE | ORAL | 0 refills | Status: DC

## 2021-11-29 ENCOUNTER — Other Ambulatory Visit: Payer: Self-pay

## 2021-11-29 MED ORDER — VENLAFAXINE HCL ER 37.5 MG PO CP24: ORAL_CAPSULE | ORAL | 0 refills | Status: DC

## 2021-12-06 ENCOUNTER — Ambulatory Visit: Payer: BC Managed Care – PPO | Admitting: Neurology

## 2021-12-06 ENCOUNTER — Encounter: Payer: Self-pay | Admitting: Neurology

## 2021-12-06 VITALS — BP 139/82 | HR 78 | Ht 70.0 in | Wt 211.0 lb

## 2021-12-06 DIAGNOSIS — G40209 Localization-related (focal) (partial) symptomatic epilepsy and epileptic syndromes with complex partial seizures, not intractable, without status epilepticus: Secondary | ICD-10-CM

## 2021-12-06 MED ORDER — CARBAMAZEPINE ER 400 MG PO TB12: 400.0000 mg | ORAL_TABLET | Freq: Three times a day (TID) | ORAL | 3 refills | Status: DC

## 2021-12-06 MED ORDER — VENLAFAXINE HCL ER 37.5 MG PO CP24: ORAL_CAPSULE | ORAL | 3 refills | Status: DC

## 2021-12-06 MED ORDER — LEVETIRACETAM 1000 MG PO TABS: 1000.0000 mg | ORAL_TABLET | Freq: Two times a day (BID) | ORAL | 3 refills | Status: DC
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
## 2021-12-06 NOTE — Patient Instructions (Signed)
Good to see you. Continue all your medications. Follow-up in 1 year, call for any changes. ? ? ?Seizure Precautions: ?1. If medication has been prescribed for you to prevent seizures, take it exactly as directed.  Do not stop taking the medicine without talking to your doctor first, even if you have not had a seizure in a long time.  ? ?2. Avoid activities in which a seizure would cause danger to yourself or to others.  Don't operate dangerous machinery, swim alone, or climb in high or dangerous places, such as on ladders, roofs, or girders.  Do not drive unless your doctor says you may. ? ?3. If you have any warning that you may have a seizure, lay down in a safe place where you can't hurt yourself.   ? ?4.  No driving for 6 months from last seizure, as per [facility].   Please refer to the following link on the [place] website for more information: http://[email or web address].org/[email or web address]/driving/drivingu.cfm  ? ?5.  Maintain good sleep hygiene. Avoid alcohol. ? ?6.  Contact your doctor if you have any problems that may be related to the medicine you are taking. ? ?7.  Call 911 and bring the patient back to the ED if: ?      ? A.  The seizure lasts longer than 5 minutes.      ? B.  The patient doesn't awaken shortly after the seizure ? C.  The patient has new problems such as difficulty seeing, speaking or moving ? D.  The patient was injured during the seizure ? E.  The patient has a temperature over 102 F (39C) ? F.  The patient vomited and now is having trouble breathing ?      ? ?

## 2022-03-31 ENCOUNTER — Encounter: Payer: Self-pay | Admitting: Podiatry

## 2022-03-31 ENCOUNTER — Ambulatory Visit: Payer: BC Managed Care – PPO | Admitting: Podiatry

## 2022-03-31 DIAGNOSIS — L600 Ingrowing nail: Secondary | ICD-10-CM | POA: Diagnosis not present

## 2022-03-31 NOTE — Progress Notes (Signed)
  Subjective:  Patient ID: Patrick Ray, male    DOB: Jun 09, 1981,   MRN: [id number]  Chief Complaint  Patient presents with   Nail Problem    Left great toe possible ingrown - swelling and redness     41 y.o. male presents for concern of left great toenail ingrown. Relates 6 months ago he hit his nail and it fell off. No grown back and ingrown and painful at the tip. Relates swelling and pain in the toe. Does have a history of cerebral palsy and seizures.  Denies any other pedal complaints. Denies n/v/f/c.   Past Medical History:  Diagnosis Date   Anxiety    Cerebral palsy ([place])    Cerebral palsy ([place])    Forceps delivery    Seizure ([place])    Seizures ([place])     Objective:  Physical Exam: Vascular: DP/PT pulses 2/4 bilateral. CFT <3 seconds. Normal hair growth on digits. No edema.  Skin. No lacerations or abrasions bilateral feet. Incurvation of bilateral borders of left hallux. Erythema and edema noted to medial border with tenderness.  Musculoskeletal: MMT 5/5 bilateral lower extremities in DF, PF, Inversion and Eversion. Deceased ROM in DF of ankle joint.  Neurological: Sensation intact to light touch.   Assessment:   1. Ingrown nail of great toe of left foot      Plan:  Patient was evaluated and treated and all questions answered. Patient requesting removal of ingrown nail today. Procedure below.  Discussed procedure and post procedure care and patient expressed understanding.  Will follow-up in 2 weeks for nail check or sooner if any problems arise.    Procedure:  Procedure: partial Nail Avulsion of left hallux bilatearl nail border.  Surgeon: Lorenda Peck, DPM  Pre-op Dx: Ingrown toenail with and without infection Post-op: Same  Place of Surgery: Office exam room.  Indications for surgery: Painful and ingrown toenail.    The patient is requesting removal of nail with chemical matrixectomy. Risks and complications were discussed with the patient for which they  understand and written consent was obtained. Under sterile conditions a total of 3 mL of  1% lidocaine plain was infiltrated in a hallux block fashion. Once anesthetized, the skin was prepped in sterile fashion. A tourniquet was then applied. Next the bilateral aspect of hallux nail border was then sharply excised making sure to remove the entire offending nail border.  Next phenol was then applied under standard conditions and copiously irrigated. Silvadene was applied. A dry sterile dressing was applied. After application of the dressing the tourniquet was removed and there is found to be an immediate capillary refill time to the digit. The patient tolerated the procedure well without any complications. Post procedure instructions were discussed the patient for which he verbally understood. Follow-up in two weeks for nail check or sooner if any problems are to arise. Discussed signs/symptoms of infection and directed to call the office immediately should any occur or go directly to the emergency room. In the meantime, encouraged to call the office with any questions, concerns, changes symptoms.   Lorenda Peck, DPM

## 2022-03-31 NOTE — Patient Instructions (Signed)

## 2022-04-01 ENCOUNTER — Telehealth: Payer: Self-pay | Admitting: *Deleted

## 2022-04-01 NOTE — Telephone Encounter (Signed)
Patient called with questions about soaking the toe after procedure, wanting to know if he can take a shower w/ covering the toe, is it ok?

## 2022-04-01 NOTE — Telephone Encounter (Signed)
Yes that should be fine

## 2022-04-04 NOTE — Telephone Encounter (Signed)
Patient notified

## 2022-04-15 ENCOUNTER — Ambulatory Visit: Payer: BC Managed Care – PPO | Admitting: Podiatry

## 2022-04-15 ENCOUNTER — Encounter: Payer: Self-pay | Admitting: Podiatry

## 2022-04-15 DIAGNOSIS — L600 Ingrowing nail: Secondary | ICD-10-CM

## 2022-04-15 NOTE — Progress Notes (Signed)
  Subjective:  Patient ID: Patrick Ray, male    DOB: 1981/02/11,   MRN: [id number]  No chief complaint on file.   41 y.o. male presents for follow-up of left ingrown nail procedure. Relates that he has been doing well and doing the soaks.  . Denies any other pedal complaints. Denies n/v/f/c.   Past Medical History:  Diagnosis Date   Anxiety    Cerebral palsy ([place])    Cerebral palsy ([place])    Forceps delivery    Seizure ([place])    Seizures ([place])     Objective:  Physical Exam: Vascular: DP/PT pulses 2/4 bilateral. CFT <3 seconds. Normal hair growth on digits. No edema.  Skin. No lacerations or abrasions bilateral feet. Left hallux nail well healed. No erythema edema or purulence noted.  Musculoskeletal: MMT 5/5 bilateral lower extremities in DF, PF, Inversion and Eversion. Deceased ROM in DF of ankle joint.  Neurological: Sensation intact to light touch.   Assessment:   1. Ingrown nail of great toe of left foot      Plan:  Patient was evaluated and treated and all questions answered. Toe was evaluated and appears to be healing well.  May discontinue soaks and neosporin.  Patient to follow-up as needed.    Lorenda Peck, DPM

## 2022-11-07 ENCOUNTER — Ambulatory Visit: Payer: BC Managed Care – PPO | Admitting: Neurology

## 2022-11-07 ENCOUNTER — Other Ambulatory Visit (INDEPENDENT_AMBULATORY_CARE_PROVIDER_SITE_OTHER): Payer: BC Managed Care – PPO

## 2022-11-07 ENCOUNTER — Encounter: Payer: Self-pay | Admitting: Neurology

## 2022-11-07 VITALS — BP 167/87 | HR 73 | Ht 70.0 in | Wt 215.2 lb

## 2022-11-07 DIAGNOSIS — G40209 Localization-related (focal) (partial) symptomatic epilepsy and epileptic syndromes with complex partial seizures, not intractable, without status epilepticus: Secondary | ICD-10-CM | POA: Diagnosis not present

## 2022-11-07 DIAGNOSIS — Z79899 Other long term (current) drug therapy: Secondary | ICD-10-CM

## 2022-11-07 LAB — VITAMIN D 25 HYDROXY (VIT D DEFICIENCY, FRACTURES): Vit D, 25-Hydroxy: 15 ng/mL — ABNORMAL LOW (ref 30–100)

## 2022-11-07 MED ORDER — VENLAFAXINE HCL ER 37.5 MG PO CP24: ORAL_CAPSULE | ORAL | 3 refills | Status: DC

## 2022-11-07 MED ORDER — CARBAMAZEPINE ER 400 MG PO TB12: 400.0000 mg | ORAL_TABLET | Freq: Three times a day (TID) | ORAL | 3 refills | Status: DC

## 2022-11-07 MED ORDER — LEVETIRACETAM 1000 MG PO TABS: 1000.0000 mg | ORAL_TABLET | Freq: Two times a day (BID) | ORAL | 3 refills | Status: DC
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
## 2022-11-09 MED ORDER — VITAMIN D (ERGOCALCIFEROL) 1.25 MG (50000 UNIT) PO CAPS: ORAL_CAPSULE | ORAL | 0 refills | Status: AC

## 2022-11-11 ENCOUNTER — Telehealth: Payer: Self-pay

## 2022-11-11 NOTE — Telephone Encounter (Signed)
Pt called an informed that vitamin D level is very low, he needs to take the high dose once a week for 8 weeks, then continue daily calcium with vitamin D after. I sent in Rx for 50,000IU once a week for 8 weeks.

## 2022-11-11 NOTE — Telephone Encounter (Signed)
-----   Message from Van Clines, MD sent at 11/09/2022  9:14 AM EDT ----- Pls let him know vitamin D level is very low, he needs to take the high dose once a week for 8 weeks, then continue daily calcium with vitamin D after. I sent in Rx for 50,000IU once a week for 8 weeks. Thanks

## 2022-12-19 ENCOUNTER — Other Ambulatory Visit: Payer: Self-pay | Admitting: Neurology

## 2022-12-21 ENCOUNTER — Telehealth: Payer: Self-pay | Admitting: Neurology

## 2022-12-21 MED ORDER — VENLAFAXINE HCL ER 37.5 MG PO CP24: ORAL_CAPSULE | ORAL | 4 refills | Status: DC

## 2022-12-21 NOTE — Telephone Encounter (Signed)
Refill sent in for pt. 

## 2022-12-21 NOTE — Telephone Encounter (Signed)
New message   Patient received text message from [facility]  -cannot filled the prescription orders, the prescribes request not to filled medication ,   1. Which medications need to be refilled? (please list name of each medication and dose if known) venlafaxine XR (EFFEXOR XR) 37.5 MG 24 hr capsule   2. Which pharmacy/location (including street and city if local pharmacy) is medication to be sent to?[facility] - [place], [place] - [address]   3. Do they need a 30 day or 90 day supply? 30 day supply

## 2023-06-02 ENCOUNTER — Other Ambulatory Visit: Payer: BC Managed Care – PPO

## 2023-06-15 ENCOUNTER — Ambulatory Visit
Admission: RE | Admit: 2023-06-15 | Discharge: 2023-06-15 | Disposition: A | Discharge status: Home / Self Care | Payer: BC Managed Care – PPO | Source: Ambulatory Visit | Attending: Neurology | Admitting: Neurology

## 2023-06-15 DIAGNOSIS — Z79899 Other long term (current) drug therapy: Secondary | ICD-10-CM

## 2023-06-15 DIAGNOSIS — G40209 Localization-related (focal) (partial) symptomatic epilepsy and epileptic syndromes with complex partial seizures, not intractable, without status epilepticus: Secondary | ICD-10-CM

## 2023-10-24 ENCOUNTER — Ambulatory Visit: Payer: BC Managed Care – PPO | Admitting: Neurology

## 2023-11-03 ENCOUNTER — Other Ambulatory Visit: Payer: Self-pay | Admitting: Neurology

## 2024-01-16 ENCOUNTER — Other Ambulatory Visit: Payer: Self-pay | Admitting: Neurology

## 2024-05-10 ENCOUNTER — Ambulatory Visit: Admitting: Neurology

## 2024-05-10 ENCOUNTER — Encounter: Payer: Self-pay | Admitting: Neurology

## 2024-05-10 VITALS — BP 143/83 | HR 78 | Ht 70.0 in | Wt 229.2 lb

## 2024-05-10 DIAGNOSIS — G40209 Localization-related (focal) (partial) symptomatic epilepsy and epileptic syndromes with complex partial seizures, not intractable, without status epilepticus: Secondary | ICD-10-CM | POA: Diagnosis not present

## 2024-05-10 MED ORDER — VENLAFAXINE HCL ER 37.5 MG PO CP24: ORAL_CAPSULE | ORAL | 4 refills | Status: AC

## 2024-05-10 MED ORDER — CARBAMAZEPINE ER 400 MG PO TB12: ORAL_TABLET | ORAL | 4 refills | Status: AC

## 2024-05-10 MED ORDER — LEVETIRACETAM 1000 MG PO TABS: 1000.0000 mg | ORAL_TABLET | Freq: Two times a day (BID) | ORAL | 4 refills | Status: AC
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
## 2024-05-10 NOTE — Patient Instructions (Signed)
 It's always a pleasure to see you! Continue all your medications. Follow-up in 1 year, call for any changes.   Seizure Precautions: 1. If medication has been prescribed for you to prevent seizures, take it exactly as directed.  Do not stop taking the medicine without talking to your doctor first, even if you have not had a seizure in a long time.   2. Avoid activities in which a seizure would cause danger to yourself or to others.  Don't operate dangerous machinery, swim alone, or climb in high or dangerous places, such as on ladders, roofs, or girders.  Do not drive unless your doctor says you may.  3. If you have any warning that you may have a seizure, lay down in a safe place where you can't hurt yourself.    4.  No driving for 6 months from last seizure, as per [place]  state law.   Please refer to the following link on the [employer]'s website for more information: http://www.epilepsyfoundation.org/answerplace/Social/driving/drivingu.cfm   5.  Maintain good sleep hygiene. Avoid alcohol.  6.  Contact your doctor if you have any problems that may be related to the medicine you are taking.  7.  Call 911 and bring the patient back to the ED if:        A.  The seizure lasts longer than 5 minutes.       B.  The patient doesn't awaken shortly after the seizure  C.  The patient has new problems such as difficulty seeing, speaking or moving  D.  The patient was injured during the seizure  E.  The patient has a temperature over 102 F (39C)  F.  The patient vomited and now is having trouble breathing
